# Patient Record
Sex: Male | Born: 1980
Health system: Southern US, Community
[De-identification: ages and names within clinical notes are randomized; demographics above are authoritative.]

## PROBLEM LIST (undated history)

## (undated) DIAGNOSIS — K760 Fatty (change of) liver, not elsewhere classified: Secondary | ICD-10-CM

## (undated) DIAGNOSIS — I880 Nonspecific mesenteric lymphadenitis: Secondary | ICD-10-CM

## (undated) DIAGNOSIS — F909 Attention-deficit hyperactivity disorder, unspecified type: Secondary | ICD-10-CM

## (undated) DIAGNOSIS — E785 Hyperlipidemia, unspecified: Secondary | ICD-10-CM

## (undated) DIAGNOSIS — E66812 Obesity, class 2: Secondary | ICD-10-CM

## (undated) DIAGNOSIS — M109 Gout, unspecified: Secondary | ICD-10-CM

## (undated) DIAGNOSIS — R7303 Prediabetes: Secondary | ICD-10-CM

## (undated) DIAGNOSIS — K219 Gastro-esophageal reflux disease without esophagitis: Secondary | ICD-10-CM

## (undated) DIAGNOSIS — I10 Essential (primary) hypertension: Secondary | ICD-10-CM

## (undated) DIAGNOSIS — F319 Bipolar disorder, unspecified: Secondary | ICD-10-CM

## (undated) DIAGNOSIS — E669 Obesity, unspecified: Secondary | ICD-10-CM

## (undated) HISTORY — DX: Obesity, class 2: E66.812

## (undated) HISTORY — DX: Fatty (change of) liver, not elsewhere classified: K76.0

## (undated) HISTORY — DX: Gastro-esophageal reflux disease without esophagitis: K21.9

## (undated) HISTORY — DX: Attention-deficit hyperactivity disorder, unspecified type: F90.9

## (undated) HISTORY — DX: Gout, unspecified: M10.9

## (undated) HISTORY — DX: Essential (primary) hypertension: I10

## (undated) HISTORY — DX: Bipolar disorder, unspecified: F31.9

## (undated) HISTORY — DX: Obesity, unspecified: E66.9

## (undated) HISTORY — DX: Prediabetes: R73.03

## (undated) HISTORY — DX: Hyperlipidemia, unspecified: E78.5

## (undated) HISTORY — DX: Nonspecific mesenteric lymphadenitis: I88.0

---

## 2008-01-07 DIAGNOSIS — K760 Fatty (change of) liver, not elsewhere classified: Secondary | ICD-10-CM

## 2008-01-07 DIAGNOSIS — I880 Nonspecific mesenteric lymphadenitis: Secondary | ICD-10-CM

## 2008-01-07 HISTORY — DX: Fatty (change of) liver, not elsewhere classified: K76.0

## 2008-01-07 HISTORY — DX: Nonspecific mesenteric lymphadenitis: I88.0

## 2008-09-24 ENCOUNTER — Ambulatory Visit: Payer: Self-pay | Admitting: Diagnostic Radiology

## 2008-09-24 ENCOUNTER — Emergency Department (HOSPITAL_BASED_OUTPATIENT_CLINIC_OR_DEPARTMENT_OTHER): Admission: EM | Admit: 2008-09-24 | Discharge: 2008-09-24 | Payer: Self-pay | Admitting: Emergency Medicine

## 2008-10-23 ENCOUNTER — Emergency Department (HOSPITAL_BASED_OUTPATIENT_CLINIC_OR_DEPARTMENT_OTHER): Admission: EM | Admit: 2008-10-23 | Discharge: 2008-10-23 | Payer: Self-pay | Admitting: Emergency Medicine

## 2008-10-23 ENCOUNTER — Ambulatory Visit: Payer: Self-pay | Admitting: Radiology

## 2010-04-11 LAB — COMPREHENSIVE METABOLIC PANEL
AST: 34 U/L (ref 0–37)
Alkaline Phosphatase: 56 U/L (ref 39–117)
BUN: 15 mg/dL (ref 6–23)
Calcium: 9.4 mg/dL (ref 8.4–10.5)
Chloride: 103 mEq/L (ref 96–112)
GFR calc non Af Amer: >60 mL/min (ref >60)
Glucose, Bld: 88 mg/dL (ref 70–99)

## 2010-04-11 LAB — CBC
HCT: 48.1 % (ref 39.0–52.0)
Hemoglobin: 16.6 g/dL (ref 13.0–17.0)
MCV: 84 fL (ref 78.0–100.0)
Platelets: 365 10*3/uL (ref 150–400)
RBC: 5.73 MIL/uL (ref 4.22–5.81)

## 2010-04-11 LAB — URINALYSIS, ROUTINE W REFLEX MICROSCOPIC
Bilirubin Urine: NEGATIVE
Glucose, UA: NEGATIVE mg/dL
Hgb urine dipstick: NEGATIVE
pH: 6.5 (ref 5.0–8.0)

## 2010-04-11 LAB — DIFFERENTIAL
Basophils Relative: 0 % (ref 0–1)
Eosinophils Absolute: 0.2 10*3/uL (ref 0.0–0.7)
Lymphs Abs: 1.9 10*3/uL (ref 0.7–4.0)
Monocytes Absolute: 1 10*3/uL (ref 0.1–1.0)
Neutro Abs: 4 10*3/uL (ref 1.7–7.7)

## 2010-04-11 LAB — LIPASE, BLOOD: Lipase: 72 U/L (ref 23–300)

## 2010-04-12 LAB — DIFFERENTIAL
Lymphocytes Relative: 38 % (ref 12–46)
Monocytes Relative: 6 % (ref 3–12)
Neutro Abs: 3.1 10*3/uL (ref 1.7–7.7)
Neutrophils Relative %: 54 % (ref 43–77)

## 2010-04-12 LAB — BASIC METABOLIC PANEL
BUN: 11 mg/dL (ref 6–23)
CO2: 31 mEq/L (ref 19–32)
Creatinine, Ser: 1.1 mg/dL (ref 0.4–1.5)
GFR calc non Af Amer: >60 mL/min (ref >60)
Potassium: 4.1 mEq/L (ref 3.5–5.1)
Sodium: 143 mEq/L (ref 135–145)

## 2010-04-12 LAB — CBC
Hemoglobin: 14.3 g/dL (ref 13.0–17.0)
MCV: 86.2 fL (ref 78.0–100.0)
Platelets: 255 10*3/uL (ref 150–400)
RBC: 4.92 MIL/uL (ref 4.22–5.81)
WBC: 5.6 10*3/uL (ref 4.0–10.5)

## 2010-04-12 LAB — POCT CARDIAC MARKERS: Troponin i, poc: <0.05 ng/mL (ref 0.00–0.09)

## 2010-11-02 IMAGING — CT CT PELVIS W/ CM
2 of 3 series · 16 of 46 positions shown, 18 images · IV contrast (APPLIED)
Comparison: None available.

CT ABDOMEN

CLINICAL DATA: Nausea vomiting and diarrhea times 1 week.  Mid
abdominal pain.

CT ABDOMEN AND PELVIS WITH CONTRAST
TECHNIQUE: Multidetector CT imaging of the abdomen and pelvis was
performed using the standard protocol following bolus
administration of intravenous contrast.
Contrast: 100 ml Omnipaque 300

[Series 2: abd/pelvis 5.0 b31f · axial · 0.73mm/px · z∈[+1014,+1434]mm · 13 of 98 slices shown, 15 images]
[im 7/98  soft-tissue]
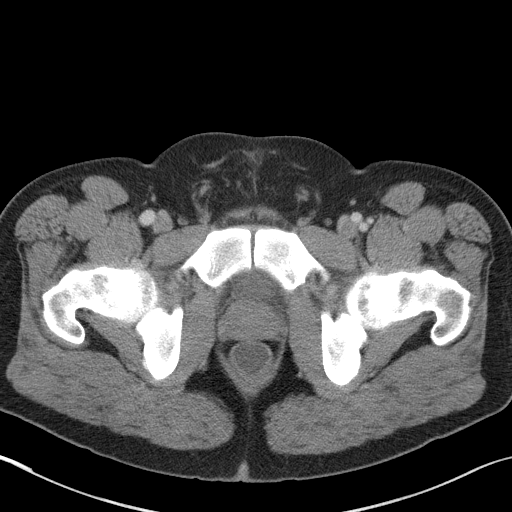
[im 7/98  bone]
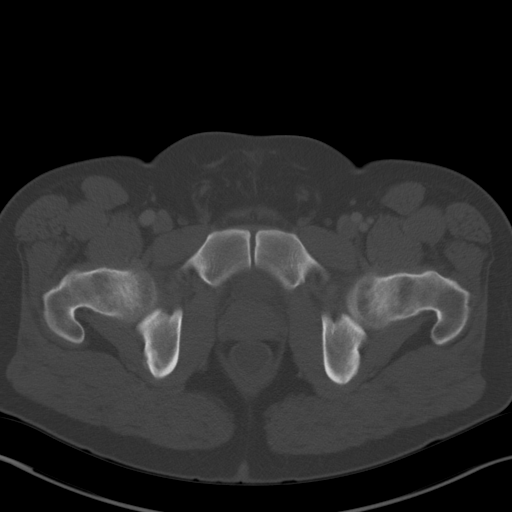
[im 13/98  soft-tissue]
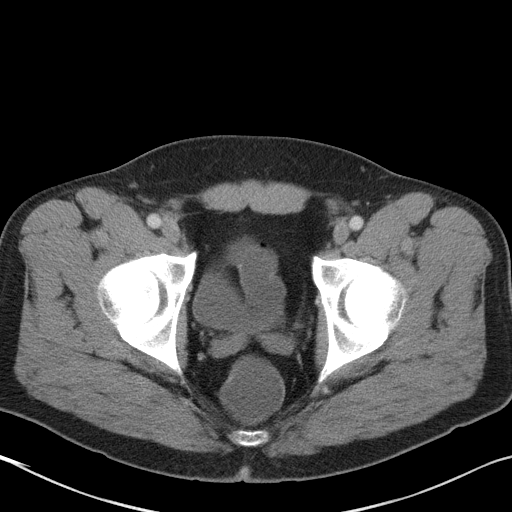
[im 19/98  soft-tissue]
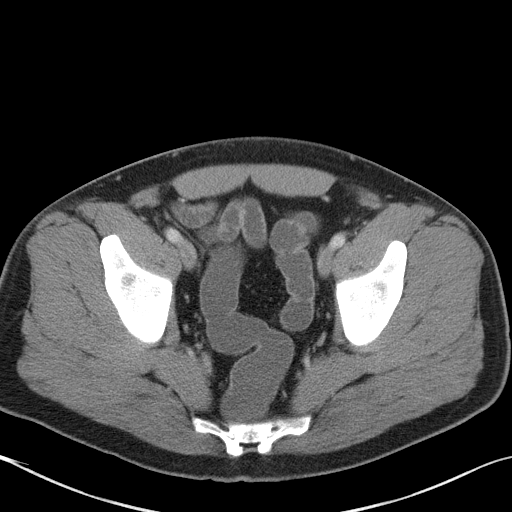
[im 29/98  soft-tissue]
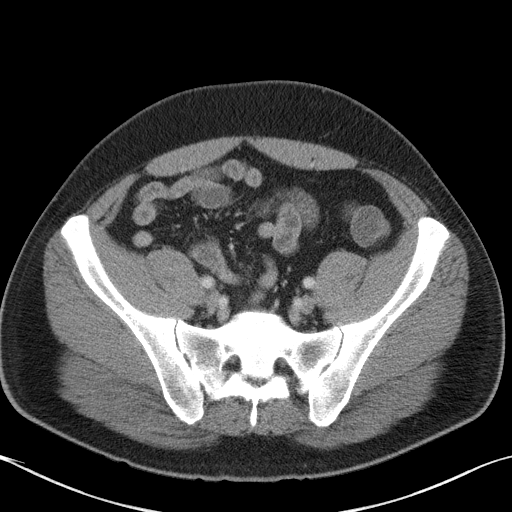
[im 35/98  soft-tissue]
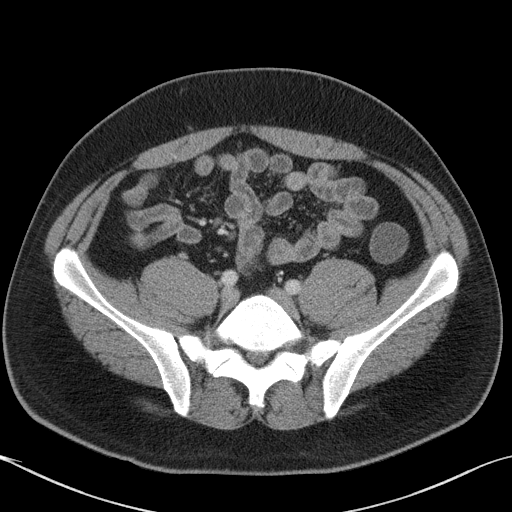
[im 41/98  soft-tissue]
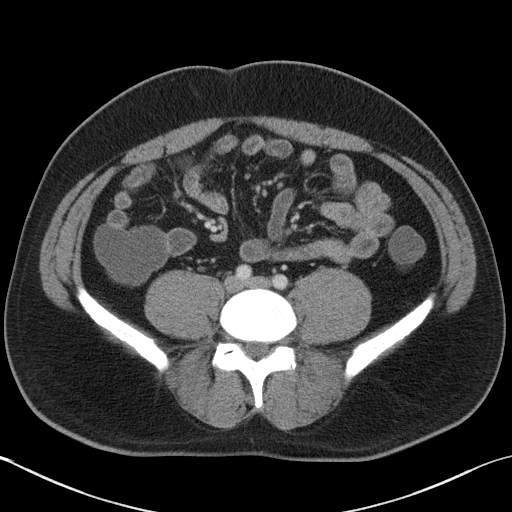
[im 51/98  soft-tissue]
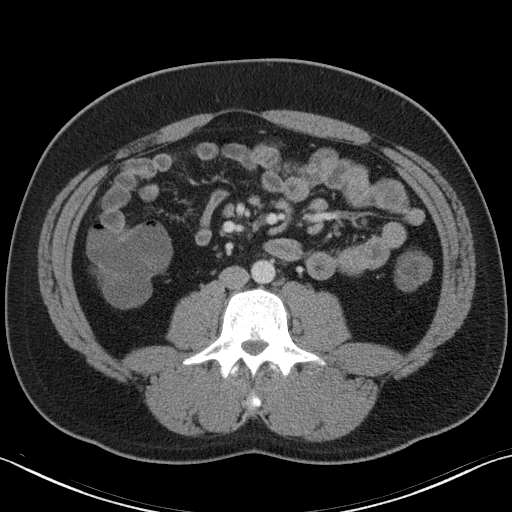
[im 57/98  soft-tissue]
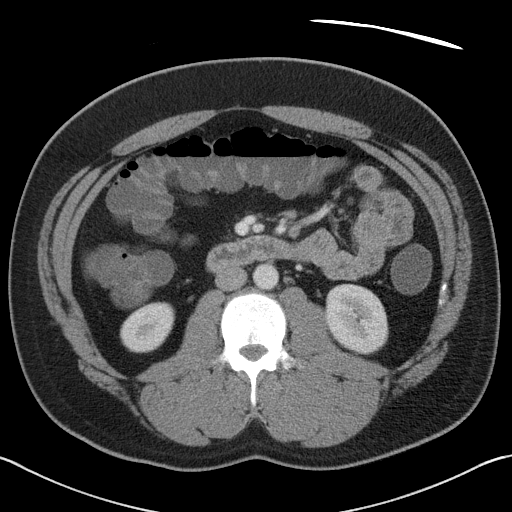
[im 63/98  soft-tissue]
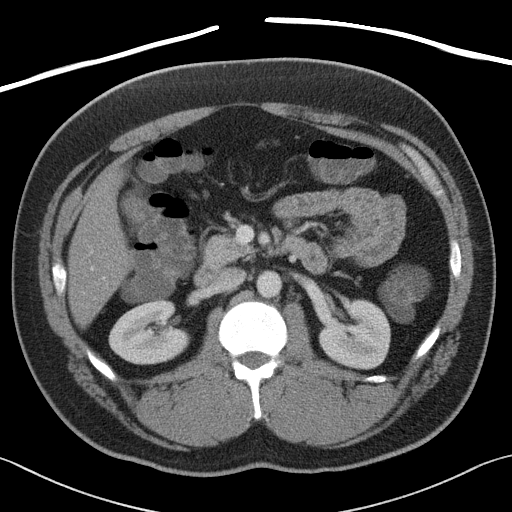
[im 63/98  bone]
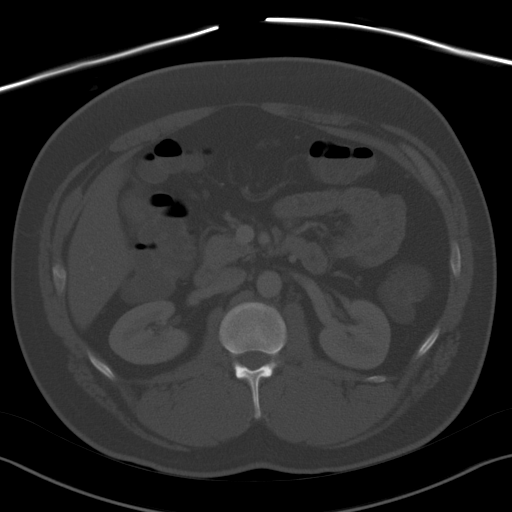
[im 69/98  soft-tissue]
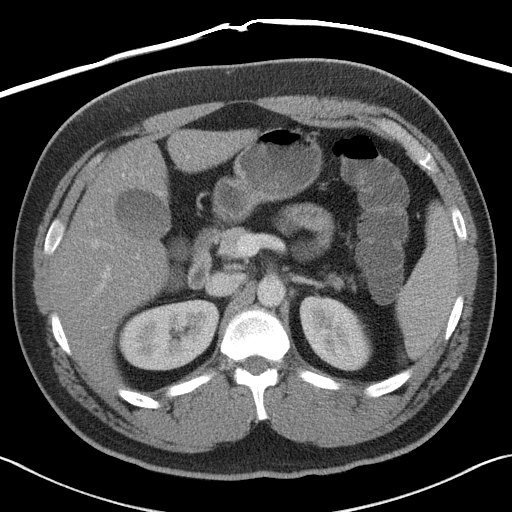
[im 79/98  soft-tissue]
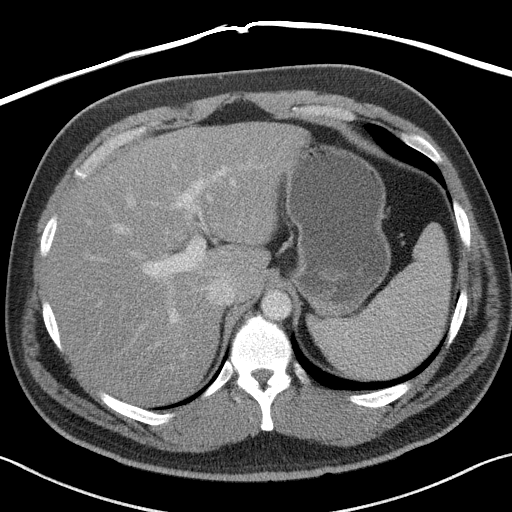
[im 85/98  soft-tissue]
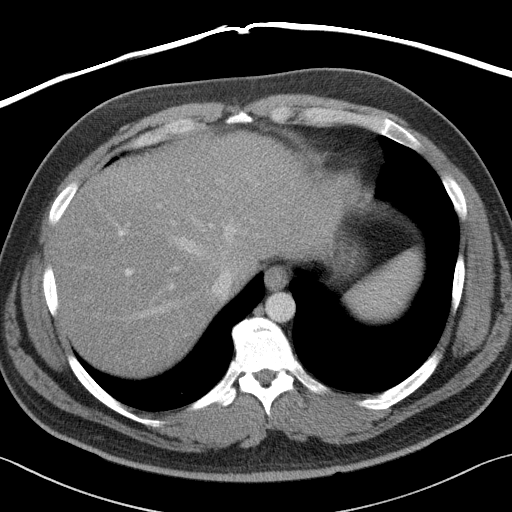
[im 91/98  soft-tissue]
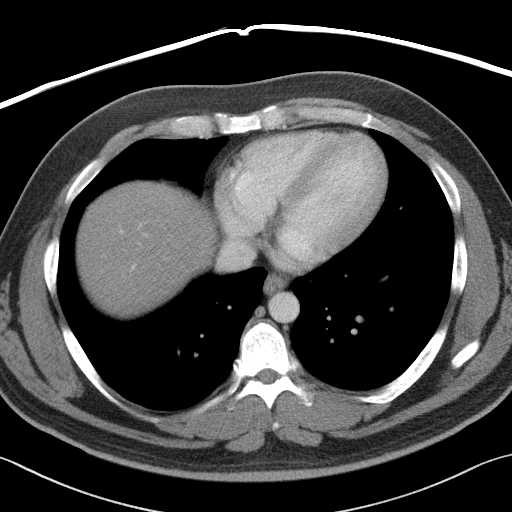

[Series 4: abd/pelvis 3.0 coronal · coronal · 0.75mm/px · 3 of 99 slices shown]
[im 33/99  soft-tissue]
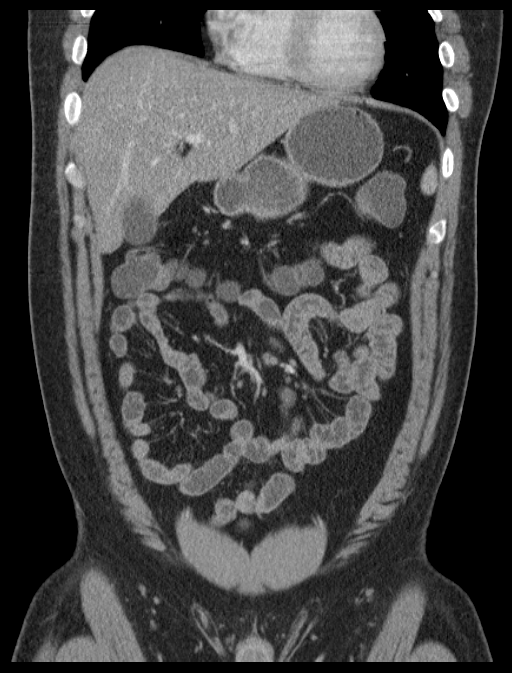
[im 44/99  soft-tissue]
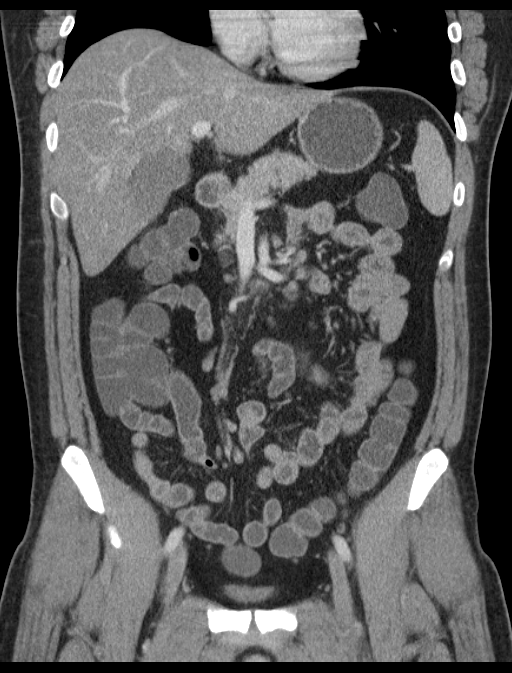
[im 55/99  soft-tissue]
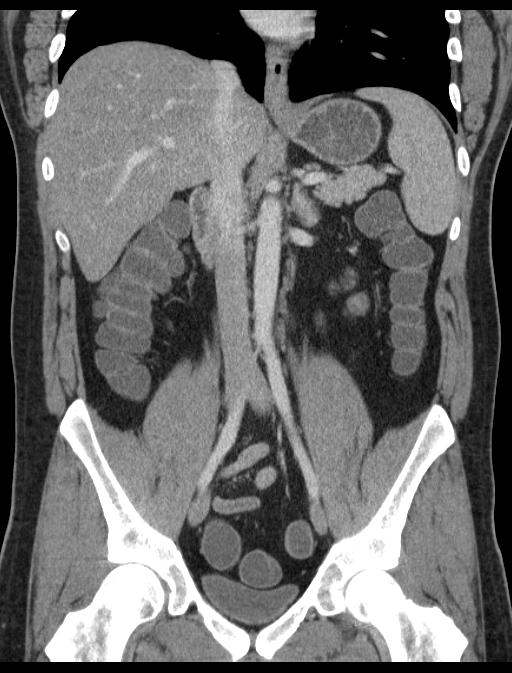

[16 of 46 positions shown; findings below may reference images not displayed]

FINDINGS: The lung bases are clear without focal nodule, mass, or
airspace disease.  The heart size is normal.  There is no
significant pleural or pericardial effusion.

There is probable diffuse fatty infiltration of the liver.  No
focal hepatic lesions are evident.  The spleen is within normal
limits.  The stomach, duodenum, and pancreas are normal.  The
common bile duct and gallbladder are within normal limits.  The
adrenal glands and kidneys are normal bilaterally.  There is no
significant abdominal adenopathy or free fluid.  The small bowel is
unremarkable.  Scattered lymph nodes are seen throughout the
mesentery.  The bone windows demonstrate degenerative change of the
lower thoracic spine.  No focal lytic or blastic lesions are
evident.
IMPRESSION: 1.  Scattered enlarged mesenteric lymph nodes.  This is compatible
with mesenteric adenitis or a nonspecific enteritis.
2.  No other acute abnormality of the abdomen.
3.  Probable diffuse fatty infiltration of the liver.

CT PELVIS
FINDINGS: The rectosigmoid and descending colon is fluid-filled
and mildly dilated.  No mass lesion or focal wall thickening is
evident.  Fluid levels are present within the transverse colon.
The appendix is visualized and within normal limits.  The distal
small bowel is unremarkable.  Urinary bladder and prostate gland
are normal.  There is no significant pelvic adenopathy or free
fluid.  The bone windows are unremarkable.
IMPRESSION: 1.  Fluid-filled colon, compatible with diarrhea.
2.  No other acute abnormality of the pelvis.

## 2015-07-16 DIAGNOSIS — Z791 Long term (current) use of non-steroidal anti-inflammatories (NSAID): Secondary | ICD-10-CM | POA: Diagnosis not present

## 2015-07-16 DIAGNOSIS — K0889 Other specified disorders of teeth and supporting structures: Secondary | ICD-10-CM | POA: Diagnosis not present

## 2015-07-16 DIAGNOSIS — R220 Localized swelling, mass and lump, head: Secondary | ICD-10-CM | POA: Diagnosis not present

## 2015-07-16 DIAGNOSIS — R590 Localized enlarged lymph nodes: Secondary | ICD-10-CM | POA: Diagnosis not present

## 2015-07-16 DIAGNOSIS — Z87891 Personal history of nicotine dependence: Secondary | ICD-10-CM | POA: Diagnosis not present

## 2015-07-16 DIAGNOSIS — K122 Cellulitis and abscess of mouth: Secondary | ICD-10-CM | POA: Diagnosis not present

## 2015-07-16 DIAGNOSIS — K029 Dental caries, unspecified: Secondary | ICD-10-CM | POA: Diagnosis not present

## 2015-07-16 DIAGNOSIS — R51 Headache: Secondary | ICD-10-CM | POA: Diagnosis not present

## 2015-07-16 DIAGNOSIS — K0381 Cracked tooth: Secondary | ICD-10-CM | POA: Diagnosis not present

## 2015-07-16 DIAGNOSIS — M26603 Bilateral temporomandibular joint disorder, unspecified: Secondary | ICD-10-CM | POA: Diagnosis not present

## 2016-01-07 DIAGNOSIS — R7303 Prediabetes: Secondary | ICD-10-CM | POA: Insufficient documentation

## 2016-01-07 DIAGNOSIS — I10 Essential (primary) hypertension: Secondary | ICD-10-CM

## 2016-01-07 DIAGNOSIS — E119 Type 2 diabetes mellitus without complications: Secondary | ICD-10-CM

## 2016-01-07 DIAGNOSIS — I152 Hypertension secondary to endocrine disorders: Secondary | ICD-10-CM | POA: Insufficient documentation

## 2016-01-07 DIAGNOSIS — E1169 Type 2 diabetes mellitus with other specified complication: Secondary | ICD-10-CM | POA: Insufficient documentation

## 2016-01-07 HISTORY — DX: Type 2 diabetes mellitus without complications: E11.9

## 2016-01-07 HISTORY — DX: Essential (primary) hypertension: I10

## 2016-02-25 DIAGNOSIS — F3181 Bipolar II disorder: Secondary | ICD-10-CM | POA: Diagnosis not present

## 2016-03-24 DIAGNOSIS — F3181 Bipolar II disorder: Secondary | ICD-10-CM | POA: Diagnosis not present

## 2016-04-18 ENCOUNTER — Ambulatory Visit: Payer: Self-pay | Admitting: Family Medicine

## 2016-04-18 ENCOUNTER — Encounter: Payer: Self-pay | Admitting: Family Medicine

## 2016-04-18 NOTE — Progress Notes (Deleted)
Office Note 04/18/2016  CC: No chief complaint on file.   HPI:  Erik Davis is a 36 y.o. male who is here to establish care Patient's most recent primary MD: *** Old records *** reviewed prior to or during today's visit.  No past medical history on file.  No past surgical history on file.  No family history on file.  Social History   Social History  . Marital status: Single    Spouse name: N/A  . Number of children: N/A  . Years of education: N/A   Occupational History  . Not on file.   Social History Main Topics  . Smoking status: Not on file  . Smokeless tobacco: Not on file  . Alcohol use Not on file  . Drug use: Unknown  . Sexual activity: Not on file   Other Topics Concern  . Not on file   Social History Narrative  . No narrative on file    No outpatient encounter prescriptions on file as of 04/18/2016.   No facility-administered encounter medications on file as of 04/18/2016.     Allergies not on file  ROS *** PE; There were no vitals taken for this visit. *** Pertinent labs:  ***  ASSESSMENT AND PLAN:   No problem-specific Assessment & Plan notes found for this encounter.   No Follow-up on file.

## 2016-04-21 DIAGNOSIS — F3181 Bipolar II disorder: Secondary | ICD-10-CM | POA: Diagnosis not present

## 2016-04-24 ENCOUNTER — Ambulatory Visit (INDEPENDENT_AMBULATORY_CARE_PROVIDER_SITE_OTHER): Payer: BLUE CROSS/BLUE SHIELD | Admitting: Family Medicine

## 2016-04-24 ENCOUNTER — Encounter: Payer: Self-pay | Admitting: Family Medicine

## 2016-04-24 VITALS — BP 148/83 | HR 79 | Temp 98.0°F | Resp 16 | Ht 73.5 in | Wt 295.8 lb

## 2016-04-24 DIAGNOSIS — Z0000 Encounter for general adult medical examination without abnormal findings: Secondary | ICD-10-CM | POA: Diagnosis not present

## 2016-04-24 DIAGNOSIS — I10 Essential (primary) hypertension: Secondary | ICD-10-CM

## 2016-04-24 MED ORDER — IRBESARTAN 150 MG PO TABS: 150.0000 mg | ORAL_TABLET | Freq: Every day | ORAL | 1 refills | Status: DC

## 2016-04-24 NOTE — Progress Notes (Signed)
Pre visit review using our clinic review tool, if applicable. No additional management support is needed unless otherwise documented below in the visit note. 

## 2016-04-24 NOTE — Progress Notes (Signed)
Office Note 04/24/2016  CC:  Chief Complaint  Patient presents with  . Establish Care  . Annual Exam    Pt is not fasting.    HPI:  Pepper Wyndham is a 36 y.o. male who is here to establish care Patient's most recent primary MD: no PCP in 15 yrs.  Sees a psychiatrist who manages his meds. Old records in [facility] EMR were reviewed prior to or during today's visit.  Eye exam: recently got corrective lenses. Dental preventative visits: "rarely".  Exercise: lifts weights, walks treadmill, plays basketball.  Elevated bp: home monitoring 140s/90s but never been treated for HTN.  Same at psych visits.   Past Medical History:  Diagnosis Date  . Adult ADHD    managed by psychiatrist  . Bipolar affective disorder (HCC)    managed by psych  . Fatty liver 2010   Noted on CT abd  . Gout    two episodes1 yr apart--big toe.  Never been on preventative meds.  . Nonspecific mesenteric adenitis 2010   Hx of    History reviewed. No pertinent surgical history.  Family History  Problem Relation Age of Onset  . Mental retardation Father   . Depression Maternal Grandmother   . Cancer Paternal Grandmother     Lung?    Social History   Social History  . Marital status: Single    Spouse name: N/A  . Number of children: N/A  . Years of education: N/A   Occupational History  . Not on file.   Social History Main Topics  . Smoking status: Former Smoker    Packs/day: 0.50    Years: 7.00    Types: Cigarettes    Quit date: 01/07/2004  . Smokeless tobacco: Never Used  . Alcohol use Yes     Comment: rare - 2-3 beers a year  . Drug use: No  . Sexual activity: Not on file   Other Topics Concern  . Not on file   Social History Narrative   Single, two teenage sons.   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   No current tobacco: 5 yr pack hx--quit 2006.   No alcohol.   No hx of prob with alc or drugs.    Outpatient Encounter Prescriptions as of 04/24/2016   Medication Sig  . amphetamine-dextroamphetamine (ADDERALL XR) 10 MG 24 hr capsule Take 10 mg by mouth daily.  Marland Kitchen lamoTRIgine (LAMICTAL) 100 MG tablet Take 100 mg by mouth daily.  . irbesartan (AVAPRO) 150 MG tablet Take 1 tablet (150 mg total) by mouth daily.   No facility-administered encounter medications on file as of 04/24/2016.     No Known Allergies  ROS Review of Systems  Constitutional: Negative for appetite change, chills, fatigue and fever.  HENT: Negative for congestion, dental problem, ear pain and sore throat.   Eyes: Negative for discharge, redness and visual disturbance.  Respiratory: Negative for cough, chest tightness, shortness of breath and wheezing.   Cardiovascular: Negative for chest pain, palpitations and leg swelling.  Gastrointestinal: Negative for abdominal pain, blood in stool, diarrhea, nausea and vomiting.  Genitourinary: Negative for difficulty urinating, dysuria, flank pain, frequency, hematuria and urgency.  Musculoskeletal: Negative for arthralgias, back pain, joint swelling, myalgias and neck stiffness.  Skin: Negative for pallor and rash.  Neurological: Negative for dizziness, speech difficulty, weakness and headaches.  Hematological: Negative for adenopathy. Does not bruise/bleed easily.  Psychiatric/Behavioral: Negative for confusion and sleep disturbance. The patient is not nervous/anxious.  PE; Blood pressure (!) 148/83, pulse 79, temperature 98 F (36.7 C), temperature source Oral, resp. rate 16, height 6' 1.5" (1.867 m), weight 295 lb 12 oz (134.2 kg), SpO2 96 %. Body mass index is 38.49 kg/m.  Gen: Alert, well appearing.  Patient is oriented to person, place, time, and situation. AFFECT: pleasant, lucid thought and speech. ENT: Ears: EACs clear, normal epithelium.  TMs with good light reflex and landmarks bilaterally.  Eyes: no injection, icteris, swelling, or exudate.  EOMI, PERRLA. Nose: no drainage or turbinate edema/swelling.  No  injection or focal lesion.  Mouth: lips without lesion/swelling.  Oral mucosa pink and moist.  Dentition intact and without obvious caries or gingival swelling.  Oropharynx without erythema, exudate, or swelling.  Neck: supple/nontender.  No LAD, mass, or TM.  Carotid pulses 2+ bilaterally, without bruits. CV: RRR, no m/r/g.   LUNGS: CTA bilat, nonlabored resps, good aeration in all lung fields. ABD: soft, NT, ND, BS normal.  No hepatospenomegaly or mass.  No bruits. EXT: no clubbing, cyanosis, or edema.  Musculoskeletal: no joint swelling, erythema, warmth, or tenderness.  ROM of all joints intact. Skin - no sores or suspicious lesions or rashes or color changes  Pertinent labs:  None today  ASSESSMENT AND PLAN:   1) Essential HTN; sounds like he's had this x 10 yrs but never treated. Start irbesartan  qd today. He'll return for lytes/cr when he comes back for fasting HP labs. Monitor bp at home 3-4 times per week, return to review these with me in 3-4 weeks. DASH diet discussed, handout given.  2) Health maintenance exam: Reviewed age and gender appropriate health maintenance issues (prudent diet, regular exercise, health risks of tobacco and excessive alcohol, use of seatbelts, fire alarms in home, use of sunscreen).  Also reviewed age and gender appropriate health screening as well as vaccine recommendations. Return when fasting for HP labs. Tetanus UTD (2015).  An After Visit Summary was printed and given to the patient.  Return f/u 3-4 weeks HTN.  fasting lab visit at pt's convenience.  Signed:  Santiago Bumpers, MD           04/24/2016

## 2016-04-25 ENCOUNTER — Other Ambulatory Visit (INDEPENDENT_AMBULATORY_CARE_PROVIDER_SITE_OTHER): Payer: BLUE CROSS/BLUE SHIELD

## 2016-04-25 DIAGNOSIS — Z0000 Encounter for general adult medical examination without abnormal findings: Secondary | ICD-10-CM | POA: Diagnosis not present

## 2016-04-25 LAB — CBC WITH DIFFERENTIAL/PLATELET
BASOS ABS: 0.1 10*3/uL (ref 0.0–0.1)
BASOS PCT: 1 % (ref 0.0–3.0)
EOS ABS: 0.1 10*3/uL (ref 0.0–0.7)
Eosinophils Relative: 1.8 % (ref 0.0–5.0)
HEMATOCRIT: 45.2 % (ref 39.0–52.0)
HEMOGLOBIN: 15.4 g/dL (ref 13.0–17.0)
Lymphocytes Relative: 44.4 % (ref 12.0–46.0)
Lymphs Abs: 3 10*3/uL (ref 0.7–4.0)
MCHC: 34 g/dL (ref 30.0–36.0)
MCV: 85.9 fl (ref 78.0–100.0)
MONO ABS: 0.4 10*3/uL (ref 0.1–1.0)
Monocytes Relative: 5.8 % (ref 3.0–12.0)
Neutro Abs: 3.2 10*3/uL (ref 1.4–7.7)
Neutrophils Relative %: 47 % (ref 43.0–77.0)
Platelets: 295 10*3/uL (ref 150.0–400.0)
RBC: 5.27 Mil/uL (ref 4.22–5.81)
RDW: 13.3 % (ref 11.5–15.5)
WBC: 6.8 10*3/uL (ref 4.0–10.5)

## 2016-04-25 LAB — COMPREHENSIVE METABOLIC PANEL
ALBUMIN: 4.5 g/dL (ref 3.5–5.2)
ALK PHOS: 42 U/L (ref 39–117)
ALT: 27 U/L (ref 0–53)
AST: 21 U/L (ref 0–37)
BUN: 10 mg/dL (ref 6–23)
CALCIUM: 9.6 mg/dL (ref 8.4–10.5)
CHLORIDE: 104 meq/L (ref 96–112)
CO2: 30 mEq/L (ref 19–32)
Creatinine, Ser: 1.09 mg/dL (ref 0.40–1.50)
GFR: 80.85 mL/min (ref >60.00)
Glucose, Bld: 126 mg/dL — ABNORMAL HIGH (ref 70–99)
POTASSIUM: 4.2 meq/L (ref 3.5–5.1)
Sodium: 139 mEq/L (ref 135–145)
TOTAL PROTEIN: 7.3 g/dL (ref 6.0–8.3)
Total Bilirubin: 0.6 mg/dL (ref 0.2–1.2)

## 2016-04-25 LAB — LIPID PANEL
CHOLESTEROL: 198 mg/dL (ref 0–200)
HDL: 32.9 mg/dL — AB (ref >39.00)
LDL CALC: 132 mg/dL — AB (ref 0–99)
NonHDL: 164.7
TRIGLYCERIDES: 163 mg/dL — AB (ref 0.0–149.0)
Total CHOL/HDL Ratio: 6
VLDL: 32.6 mg/dL (ref 0.0–40.0)

## 2016-04-25 LAB — TSH: TSH: 1.17 u[IU]/mL (ref 0.35–4.50)

## 2016-04-28 ENCOUNTER — Other Ambulatory Visit (INDEPENDENT_AMBULATORY_CARE_PROVIDER_SITE_OTHER): Payer: BLUE CROSS/BLUE SHIELD

## 2016-04-28 ENCOUNTER — Other Ambulatory Visit: Payer: Self-pay | Admitting: Family Medicine

## 2016-04-28 DIAGNOSIS — R7301 Impaired fasting glucose: Secondary | ICD-10-CM

## 2016-04-28 LAB — HEMOGLOBIN A1C: HEMOGLOBIN A1C: 5.9 % (ref 4.6–6.5)

## 2016-05-16 ENCOUNTER — Ambulatory Visit (INDEPENDENT_AMBULATORY_CARE_PROVIDER_SITE_OTHER): Payer: BLUE CROSS/BLUE SHIELD | Admitting: Family Medicine

## 2016-05-16 ENCOUNTER — Encounter: Payer: Self-pay | Admitting: Family Medicine

## 2016-05-16 VITALS — BP 131/69 | HR 73 | Temp 98.1°F | Resp 16 | Ht 73.5 in | Wt 289.5 lb

## 2016-05-16 DIAGNOSIS — R7301 Impaired fasting glucose: Secondary | ICD-10-CM

## 2016-05-16 DIAGNOSIS — I10 Essential (primary) hypertension: Secondary | ICD-10-CM

## 2016-05-16 NOTE — Progress Notes (Signed)
OFFICE VISIT  05/16/2016   CC:  Chief Complaint  Patient presents with  . Follow-up    HTN,    HPI:    Patient is a 36 y.o.  male who presents for 3 week f/u HTN--started irbesartan 150mg  qd last visit.  Also, fasting labs 04/25/16 were all normal except fasting glucose was 126, HbA1c was 5.9% at that time. He was told to return for repeat fasting serum glucose.  He now says that he forgot to tell me that he did have a coke the morning before he got his "fasting" labs.  He also did not fast a full 8 hours prior to that.  HOME bp's: low 130s/70 avg, no HR info. No side effects from the irbesartan.  Past Medical History:  Diagnosis Date  . Adult ADHD    managed by psychiatrist  . Bipolar affective disorder (HCC)    managed by psych  . Fatty liver 2010   Noted on CT abd  . Gout    two episodes1 yr apart--big toe.  Never been on preventative meds.  . Nonspecific mesenteric adenitis 2010   Hx of    No past surgical history on file.  Outpatient Medications Prior to Visit  Medication Sig Dispense Refill  . amphetamine-dextroamphetamine (ADDERALL XR) 10 MG 24 hr capsule Take 10 mg by mouth daily.    . irbesartan (AVAPRO) 150 MG tablet Take 1 tablet (150 mg total) by mouth daily. 30 tablet 1  . lamoTRIgine (LAMICTAL) 100 MG tablet Take 100 mg by mouth daily.     No facility-administered medications prior to visit.     No Known Allergies  ROS As per HPI  PE: Blood pressure 131/69, pulse 73, temperature 98.1 F (36.7 C), temperature source Oral, resp. rate 16, height 6' 1.5" (1.867 m), weight 289 lb 8 oz (131.3 kg), SpO2 96 %. Gen: Alert, well appearing.  Patient is oriented to person, place, time, and situation. AFFECT: pleasant, lucid thought and speech. CV: RRR, no m/r/g.   LUNGS: CTA bilat, nonlabored resps, good aeration in all lung fields. EXT: no clubbing, cyanosis, or edema.   LABS:    Chemistry      Component Value Date/Time   NA 139 04/25/2016 0808   K  4.2 04/25/2016 0808   CL 104 04/25/2016 0808   CO2 30 04/25/2016 0808   BUN 10 04/25/2016 0808   CREATININE 1.09 04/25/2016 0808      Component Value Date/Time   CALCIUM 9.6 04/25/2016 0808   ALKPHOS 42 04/25/2016 0808   AST 21 04/25/2016 0808   ALT 27 04/25/2016 0808   BILITOT 0.6 04/25/2016 0808     Glucose 04/25/16= 126.  Lab Results  Component Value Date   HGBA1C 5.9 04/28/2016   IMPRESSION AND PLAN:  1) HTN; new dx.  Doing well on irbesartan 150mg  qd. Continue periodic home bp monitoring.  Call or return if persistently >140 systolic or > 90 diastolic.  2) IFG: sounds like he actually was NOT fasting when glucose was 126. We did discuss the fact that his A1c technically puts him in the prediabetic range. He will continue with his good efforts at a healthy lifestyle + wt loss---he has lost 15-20 lbs over the last 3 mo per his report today. He will still keep the plan of returning for fasting lab visit to recheck serum glucose.  An After Visit Summary was printed and given to the patient.  FOLLOW UP: Return in about 6 months (around 11/16/2016)  for routine chronic illness f/u (also, lab visit for fasting glucose at pt's earliest convenience).  Signed:  Santiago BumpersPhil McGowen, MD           05/16/2016

## 2016-05-26 ENCOUNTER — Other Ambulatory Visit: Payer: BLUE CROSS/BLUE SHIELD

## 2016-05-29 ENCOUNTER — Other Ambulatory Visit: Payer: BLUE CROSS/BLUE SHIELD

## 2016-05-29 ENCOUNTER — Other Ambulatory Visit: Payer: Self-pay

## 2016-06-04 ENCOUNTER — Other Ambulatory Visit: Payer: BLUE CROSS/BLUE SHIELD

## 2016-06-16 DIAGNOSIS — F3181 Bipolar II disorder: Secondary | ICD-10-CM | POA: Diagnosis not present

## 2016-06-18 ENCOUNTER — Other Ambulatory Visit: Payer: Self-pay | Admitting: Family Medicine

## 2016-09-15 ENCOUNTER — Telehealth: Payer: Self-pay | Admitting: *Deleted

## 2016-09-15 NOTE — Telephone Encounter (Signed)
Pt advised and voiced understanding. Apt made for tomorrow (09/16/16) at 9:45am.

## 2016-09-15 NOTE — Telephone Encounter (Signed)
Pt LMOM on 09/15/16 at 3:43pm wanting a call to see if Dr. Milinda CaveMcGowen will be willing to start filling medications he gets from his specialist.   SW pt he wants to know if Dr. Milinda CaveMcGowen will fill his lamotrigine and Adderall. He stated that he takes the lamotrigine for his Bipolar disorder and the Adderall for his ADHD. He stated that he has to pay a $45 copay to see the specialist. Please advise. Thanks.

## 2016-09-15 NOTE — Telephone Encounter (Signed)
I will rake over prescribing responsibilities for his psychiatric meds, BUT I need to see him in office regarding his ADHD and bipolar disorder before I start rx'ing them.-thx

## 2016-09-16 ENCOUNTER — Ambulatory Visit (INDEPENDENT_AMBULATORY_CARE_PROVIDER_SITE_OTHER): Payer: BLUE CROSS/BLUE SHIELD | Admitting: Family Medicine

## 2016-09-16 ENCOUNTER — Encounter: Payer: Self-pay | Admitting: Family Medicine

## 2016-09-16 VITALS — BP 132/78 | HR 73 | Temp 97.9°F | Resp 16 | Ht 73.5 in | Wt 290.2 lb

## 2016-09-16 DIAGNOSIS — Z23 Encounter for immunization: Secondary | ICD-10-CM

## 2016-09-16 DIAGNOSIS — I10 Essential (primary) hypertension: Secondary | ICD-10-CM

## 2016-09-16 DIAGNOSIS — F909 Attention-deficit hyperactivity disorder, unspecified type: Secondary | ICD-10-CM | POA: Insufficient documentation

## 2016-09-16 DIAGNOSIS — R7303 Prediabetes: Secondary | ICD-10-CM | POA: Diagnosis not present

## 2016-09-16 DIAGNOSIS — F3172 Bipolar disorder, in full remission, most recent episode hypomanic: Secondary | ICD-10-CM

## 2016-09-16 DIAGNOSIS — F319 Bipolar disorder, unspecified: Secondary | ICD-10-CM | POA: Insufficient documentation

## 2016-09-16 MED ORDER — AMPHETAMINE-DEXTROAMPHET ER 10 MG PO CP24: 10.0000 mg | ORAL_CAPSULE | Freq: Every day | ORAL | 0 refills | Status: DC

## 2016-09-16 MED ORDER — LAMOTRIGINE 100 MG PO TABS: 100.0000 mg | ORAL_TABLET | Freq: Every day | ORAL | 3 refills | Status: DC

## 2016-09-16 NOTE — Addendum Note (Signed)
Addended by: Smitty KnudsenSUTHERLAND,  K on: 09/16/2016 10:24 AM   Modules accepted: Orders

## 2016-09-16 NOTE — Progress Notes (Signed)
OFFICE VISIT  09/16/2016   CC:  Chief Complaint  Patient presents with  . ADHD  . Manic Behavior   HPI:    Patient is a 36 y.o. Caucasian male who presents for discussion of adult ADD and bipolar disorder. He has called and requested that I assume responsibility for prescribing his psychiatric meds b/c he is tired of paying a high copay to continue seeing his psychiatrist.  ADD: takes adderall just when he is working, 10 mg xr once daily.  This has been stable regarding focus, concentration, impulsivity, fidgeting, hyperactivity, motivation.  Bipolar d/o: doing excellent on lamictal 100mg  qd.  He had irritability/anger/hypomanic behavior, no depressed features.  He has no side effects.   Past Medical History:  Diagnosis Date  . Adult ADHD    managed by psychiatrist  . Bipolar affective disorder (HCC)    managed by psych  . Essential hypertension 2018  . Fatty liver 2010   Noted on CT abd  . Gout    two episodes1 yr apart--big toe.  Never been on preventative meds.  . Nonspecific mesenteric adenitis 2010   Hx of  . Prediabetes 2018   HbA1c 5.9%.    History reviewed. No pertinent surgical history.  Outpatient Medications Prior to Visit  Medication Sig Dispense Refill  . irbesartan (AVAPRO) 150 MG tablet TAKE 1 TABLET (150 MG TOTAL) BY MOUTH DAILY. 30 tablet 5  . amphetamine-dextroamphetamine (ADDERALL XR) 10 MG 24 hr capsule Take 10 mg by mouth daily.    Marland Kitchen. lamoTRIgine (LAMICTAL) 100 MG tablet Take 100 mg by mouth daily.     No facility-administered medications prior to visit.     No Known Allergies  ROS As per HPI  PE: Blood pressure 132/78, pulse 73, temperature 97.9 F (36.6 C), temperature source Oral, resp. rate 16, height 6' 1.5" (1.867 m), weight 290 lb 4 oz (131.7 kg), SpO2 97 %. Wt Readings from Last 2 Encounters:  09/16/16 290 lb 4 oz (131.7 kg)  05/16/16 289 lb 8 oz (131.3 kg)    Gen: alert, oriented x 4, affect pleasant.  Lucid thinking and  conversation noted. HEENT: PERRLA, EOMI.   Neck: no LAD, mass, or thyromegaly. CV: RRR, no m/r/g LUNGS: CTA bilat, nonlabored. NEURO: no tremor or tics noted on observation.  Coordination intact. CN 2-12 grossly intact bilaterally, strength 5/5 in all extremeties.  No ataxia.   LABS:    Chemistry      Component Value Date/Time   NA 139 04/25/2016 0808   K 4.2 04/25/2016 0808   CL 104 04/25/2016 0808   CO2 30 04/25/2016 0808   BUN 10 04/25/2016 0808   CREATININE 1.09 04/25/2016 0808      Component Value Date/Time   CALCIUM 9.6 04/25/2016 0808   ALKPHOS 42 04/25/2016 0808   AST 21 04/25/2016 0808   ALT 27 04/25/2016 0808   BILITOT 0.6 04/25/2016 0808       IMPRESSION AND PLAN:  1) Bipolar affective d/o--most recent episode hypomanic--now in full remission. Continue lamictal 100 mg qd--RF'd today.  2) Adult ADHD: The current medical regimen is effective;  continue present plan and medications. I printed rx's for adderall XR 10mg , 1 tab po qd today for this month, Oct, and Nov 2018.  Appropriate fill on/after date was noted on each rx.  An After Visit Summary was printed and given to the patient.  FOLLOW UP: Return in about 3 months (around 12/16/2016) for routine chronic illness f/u.  Signed:  Michele McalpinePhil  , MD           09/16/2016

## 2016-11-17 ENCOUNTER — Encounter: Payer: Self-pay | Admitting: Family Medicine

## 2016-11-17 ENCOUNTER — Encounter: Payer: Self-pay | Admitting: *Deleted

## 2016-11-17 ENCOUNTER — Ambulatory Visit (INDEPENDENT_AMBULATORY_CARE_PROVIDER_SITE_OTHER): Payer: BLUE CROSS/BLUE SHIELD | Admitting: Family Medicine

## 2016-11-17 ENCOUNTER — Other Ambulatory Visit: Payer: Self-pay

## 2016-11-17 VITALS — BP 141/77 | HR 78 | Temp 98.1°F | Resp 16 | Ht 73.5 in | Wt 293.0 lb

## 2016-11-17 DIAGNOSIS — E669 Obesity, unspecified: Secondary | ICD-10-CM

## 2016-11-17 DIAGNOSIS — I10 Essential (primary) hypertension: Secondary | ICD-10-CM

## 2016-11-17 DIAGNOSIS — R7303 Prediabetes: Secondary | ICD-10-CM

## 2016-11-17 DIAGNOSIS — F909 Attention-deficit hyperactivity disorder, unspecified type: Secondary | ICD-10-CM | POA: Diagnosis not present

## 2016-11-17 LAB — BASIC METABOLIC PANEL
BUN: 9 mg/dL (ref 6–23)
CALCIUM: 10.2 mg/dL (ref 8.4–10.5)
CO2: 29 meq/L (ref 19–32)
CREATININE: 1.11 mg/dL (ref 0.40–1.50)
Chloride: 102 mEq/L (ref 96–112)
GFR: 78.93 mL/min (ref >60.00)
GLUCOSE: 134 mg/dL — AB (ref 70–99)
Potassium: 4.2 mEq/L (ref 3.5–5.1)
Sodium: 139 mEq/L (ref 135–145)

## 2016-11-17 LAB — HEMOGLOBIN A1C: Hgb A1c MFr Bld: 5.6 % (ref 4.6–6.5)

## 2016-11-17 MED ORDER — AMPHETAMINE-DEXTROAMPHET ER 10 MG PO CP24: 10.0000 mg | ORAL_CAPSULE | Freq: Every day | ORAL | 0 refills | Status: DC

## 2016-11-17 NOTE — Progress Notes (Signed)
OFFICE VISIT  11/17/2016   CC:  Chief Complaint  Patient presents with  . Follow-up    RCI, pt is fasting.    HPI:    Patient is a 36 y.o.  male who presents for f/u HTN, IFG/prediabetes, obesity. Also, here for ADHD med f/u.  HTN: 130/75-80 avg.  No side effects from med.   Exercise: goes to [facility] 2 X/week: treadmill and weight. Diet: not working on anything in particular.  Goal wt 280 lbs.  Wants to start with cutting out sodas.  ADHD: doing well on adderall at current dosing--helping well with focus, concentration, level of frustration, goal directed behavior, etc.  No side effects.   Discussed controlled substance contract today: signed and in chart.  Reviewed [place] online controlled substance database and no suspicious activity noted.  Past Medical History:  Diagnosis Date  . Adult ADHD    managed by psychiatrist up until 09/2016  . Bipolar affective disorder (HCC)    managed by psych up until 09/2016  . Essential hypertension 2018  . Fatty liver 2010   Noted on CT abd  . Gout    two episodes1 yr apart--big toe.  Never been on preventative meds.  . Nonspecific mesenteric adenitis 2010   Hx of  . Obesity, Class II, BMI 35-39.9   . Prediabetes 2018   HbA1c 5.9%.    History reviewed. No pertinent surgical history.  Outpatient Medications Prior to Visit  Medication Sig Dispense Refill  . irbesartan (AVAPRO) 150 MG tablet TAKE 1 TABLET (150 MG TOTAL) BY MOUTH DAILY. 30 tablet 5  . lamoTRIgine (LAMICTAL) 100 MG tablet Take 1 tablet (100 mg total) by mouth daily. 90 tablet 3  . amphetamine-dextroamphetamine (ADDERALL XR) 10 MG 24 hr capsule Take 1 capsule (10 mg total) by mouth daily. 30 capsule 0   No facility-administered medications prior to visit.     No Known Allergies  ROS As per HPI  PE: Blood pressure (!) 141/77, pulse 78, temperature 98.1 F (36.7 C), temperature source Oral, resp. rate 16, height 6' 1.5" (1.867 m), weight 293 lb (132.9 kg), SpO2 96 %. Body  mass index is 38.13 kg/m.  Gen: Alert, well appearing.  Patient is oriented to person, place, time, and situation. AFFECT: pleasant, lucid thought and speech. No further exam today.  LABS:  Lab Results  Component Value Date   TSH 1.17 04/25/2016   Lab Results  Component Value Date   WBC 6.8 04/25/2016   HGB 15.4 04/25/2016   HCT 45.2 04/25/2016   MCV 85.9 04/25/2016   PLT 295.0 04/25/2016   Lab Results  Component Value Date   CREATININE 1.09 04/25/2016   BUN 10 04/25/2016   NA 139 04/25/2016   K 4.2 04/25/2016   CL 104 04/25/2016   CO2 30 04/25/2016   Lab Results  Component Value Date   ALT 27 04/25/2016   AST 21 04/25/2016   ALKPHOS 42 04/25/2016   BILITOT 0.6 04/25/2016   Lab Results  Component Value Date   CHOL 198 04/25/2016   Lab Results  Component Value Date   HDL 32.90 (L) 04/25/2016   Lab Results  Component Value Date   LDLCALC 132 (H) 04/25/2016   Lab Results  Component Value Date   TRIG 163.0 (H) 04/25/2016   Lab Results  Component Value Date   CHOLHDL 6 04/25/2016   Lab Results  Component Value Date   HGBA1C 5.9 04/28/2016    IMPRESSION AND PLAN:  1) HTN:  The current medical regimen is effective;  continue present plan and medications. Lytes/cr today.  2) Prediabetes/IFG: discussed diet some, as well as trying to get more aggressive with exercise. Check A1c today.  3) Obesity: discussed some basic dietary changes he can start, increase exercise to 3-4 days per week for now. He stated a goal wt today of 280 lbs.  I think this is a good start.  4) Adult ADHD: The current medical regimen is effective;  continue present plan and medications. I printed rx's for adderall xr 10mg , 1 tab po qd today for Dec 2018, Jan 2019, and Feb 2019.  Appropriate fill on/after date was noted on each rx. Controlled substance contract discussed, signed, and in chart. Will likely do UDS at next f/u in 3 mo.  An After Visit Summary was printed and given  to the patient.  FOLLOW UP: Return in about 3 months (around 02/17/2017) for routine chronic illness f/u.  Signed:  Santiago BumpersPhil , MD           11/17/2016

## 2016-11-21 ENCOUNTER — Encounter: Payer: Self-pay | Admitting: *Deleted

## 2016-11-21 ENCOUNTER — Other Ambulatory Visit: Payer: Self-pay | Admitting: Family Medicine

## 2016-12-24 ENCOUNTER — Encounter: Payer: Self-pay | Admitting: *Deleted

## 2016-12-24 ENCOUNTER — Ambulatory Visit: Payer: BLUE CROSS/BLUE SHIELD | Admitting: Family Medicine

## 2016-12-24 ENCOUNTER — Encounter: Payer: Self-pay | Admitting: Family Medicine

## 2016-12-24 VITALS — BP 141/87 | HR 95 | Temp 98.0°F | Resp 16 | Ht 73.5 in | Wt 293.5 lb

## 2016-12-24 DIAGNOSIS — M109 Gout, unspecified: Secondary | ICD-10-CM | POA: Diagnosis not present

## 2016-12-24 MED ORDER — HYDROCODONE-ACETAMINOPHEN 5-325 MG PO TABS
1.0000 | ORAL_TABLET | Freq: Four times a day (QID) | ORAL | 0 refills | Status: DC | PRN
Start: 2016-12-24 — End: 2017-04-30

## 2016-12-24 MED ORDER — PREDNISONE 20 MG PO TABS: ORAL_TABLET | ORAL | 0 refills | Status: DC

## 2016-12-24 NOTE — Progress Notes (Signed)
OFFICE VISIT  12/24/2016   CC:  Chief Complaint  Patient presents with  . Ankle Pain    left   HPI:    Patient is a 36 y.o. Caucasian male who presents for left foot pain. Onset yesterday morning, very painful, red, swollen.  Gradually worsening of all sx's. No change in diet recently.  No alcohol. No f/c/malaise. NO otc meds tried. Last gout attack was about 2 yrs ago: always L big toe in the past. No recent sprain or trauma.  Past Medical History:  Diagnosis Date  . Adult ADHD    managed by psychiatrist up until 09/2016  . Bipolar affective disorder (HCC)    managed by psych up until 09/2016  . Essential hypertension 2018  . Fatty liver 2010   Noted on CT abd  . Gout    two episodes1 yr apart--big toe.  Never been on preventative meds.  . Nonspecific mesenteric adenitis 2010   Hx of  . Obesity, Class II, BMI 35-39.9   . Prediabetes 2018   HbA1c 5.9%.    History reviewed. No pertinent surgical history.  Outpatient Medications Prior to Visit  Medication Sig Dispense Refill  . amphetamine-dextroamphetamine (ADDERALL XR) 10 MG 24 hr capsule Take 1 capsule (10 mg total) daily by mouth. 30 capsule 0  . irbesartan (AVAPRO) 150 MG tablet TAKE 1 TABLET BY MOUTH EVERY DAY 30 tablet 5  . lamoTRIgine (LAMICTAL) 100 MG tablet Take 1 tablet (100 mg total) by mouth daily. 90 tablet 3   No facility-administered medications prior to visit.     No Known Allergies  ROS As per HPI  PE: Blood pressure (!) 141/87, pulse 95, temperature 98 F (36.7 C), temperature source Oral, resp. rate 16, height 6' 1.5" (1.867 m), weight 293 lb 8 oz (133.1 kg), SpO2 97 %. Gen: Alert, well appearing.  Patient is oriented to person, place, time, and situation. AFFECT: pleasant, lucid thought and speech. Left ankle: decreased ROM due to pain. Mild swelling, erythema, and warmth of anteromedial aspect of ankle.  Exquisitely tender in this region--up to about mid MTP area on 1st and 2nd toes.   Distal to this there is no abnormality or tenderness.  LABS:    Chemistry      Component Value Date/Time   NA 139 11/17/2016 0828   K 4.2 11/17/2016 0828   CL 102 11/17/2016 0828   CO2 29 11/17/2016 0828   BUN 9 11/17/2016 0828   CREATININE 1.11 11/17/2016 0828      Component Value Date/Time   CALCIUM 10.2 11/17/2016 0828   ALKPHOS 42 04/25/2016 0808   AST 21 04/25/2016 0808   ALT 27 04/25/2016 0808   BILITOT 0.6 04/25/2016 0808      IMPRESSION AND PLAN:  Acute gouty arthritis of left ankle. Prednisone 40mg  qd x 5d. Vicodin 5/325, 1-2 q6h prn, #15. Therapeutic expectations and side effect profile of medication discussed today.  Patient's questions answered. He is aware of low purine dietary recommendations. Has very infrequent gout flares, so there is no indication for preventative med at this time.  An After Visit Summary was printed and given to the patient.  FOLLOW UP: Return if symptoms worsen or fail to improve.  Signed:  Santiago BumpersPhil McGowen, MD           12/24/2016

## 2017-02-16 ENCOUNTER — Ambulatory Visit: Payer: BLUE CROSS/BLUE SHIELD | Admitting: Family Medicine

## 2017-03-03 ENCOUNTER — Ambulatory Visit: Payer: BLUE CROSS/BLUE SHIELD | Admitting: Family Medicine

## 2017-03-09 ENCOUNTER — Ambulatory Visit: Payer: BLUE CROSS/BLUE SHIELD | Admitting: Family Medicine

## 2017-03-09 ENCOUNTER — Encounter: Payer: Self-pay | Admitting: Family Medicine

## 2017-03-09 VITALS — BP 126/76 | HR 76 | Resp 16 | Ht 73.5 in | Wt 293.0 lb

## 2017-03-09 DIAGNOSIS — R7303 Prediabetes: Secondary | ICD-10-CM | POA: Diagnosis not present

## 2017-03-09 DIAGNOSIS — F988 Other specified behavioral and emotional disorders with onset usually occurring in childhood and adolescence: Secondary | ICD-10-CM | POA: Diagnosis not present

## 2017-03-09 DIAGNOSIS — Z79899 Other long term (current) drug therapy: Secondary | ICD-10-CM | POA: Diagnosis not present

## 2017-03-09 DIAGNOSIS — I10 Essential (primary) hypertension: Secondary | ICD-10-CM

## 2017-03-09 DIAGNOSIS — E669 Obesity, unspecified: Secondary | ICD-10-CM

## 2017-03-09 MED ORDER — AMPHETAMINE-DEXTROAMPHET ER 10 MG PO CP24: 10.0000 mg | ORAL_CAPSULE | Freq: Every day | ORAL | 0 refills | Status: DC

## 2017-03-09 NOTE — Progress Notes (Signed)
OFFICE VISIT  03/09/2017   CC:  Chief Complaint  Patient presents with  . Follow-up    RCI   HPI:    Patient is a 37 y.o. Caucasian male who presents for f/u HTN, prediabetes/obesity, adult ADD.  HTN: no home monitoring lately.   Has not had his bp med in the last 2 wks due to shortage of the med.  BP check two weeks ago OFF the med was 120s/80s. He does have bp cuff at home.  Prediab/obesity:  Going to GYM daily--cardio and wt's. Diet: no changes.  Adult ADD:  Pt states all is going well with the med at current dosing: much improved focus, concentration, task completion.  Less frustration, better multitasking, less impulsivity and restlessness.  Mood is stable. No side effects from the medication. Most recent adderall dose was yesterday. Nothing else should show up in UDS.  ROS: no CP, no dizziness, no HAs, no SOB, no claudication, no myalgias.  Past Medical History:  Diagnosis Date  . Adult ADHD    managed by psychiatrist up until 09/2016  . Bipolar affective disorder (HCC)    managed by psych up until 09/2016  . Essential hypertension 2018  . Fatty liver 2010   Noted on CT abd  . Gout    two episodes1 yr apart--big toe.  Never been on preventative meds.  . Nonspecific mesenteric adenitis 2010   Hx of  . Obesity, Class II, BMI 35-39.9   . Prediabetes 2018   HbA1c 5.9%.    History reviewed. No pertinent surgical history.  Outpatient Medications Prior to Visit  Medication Sig Dispense Refill  . HYDROcodone-acetaminophen (NORCO/VICODIN) 5-325 MG tablet Take 1-2 tablets by mouth every 6 (six) hours as needed for moderate pain. 15 tablet 0  . irbesartan (AVAPRO) 150 MG tablet TAKE 1 TABLET BY MOUTH EVERY DAY 30 tablet 5  . lamoTRIgine (LAMICTAL) 100 MG tablet Take 1 tablet (100 mg total) by mouth daily. 90 tablet 3  . predniSONE (DELTASONE) 20 MG tablet 2 tabs po qd x 5d 10 tablet 0  . amphetamine-dextroamphetamine (ADDERALL XR) 10 MG 24 hr capsule Take 1 capsule (10  mg total) daily by mouth. 30 capsule 0   No facility-administered medications prior to visit.     No Known Allergies  ROS As per HPI  PE: Blood pressure 126/76, pulse 76, resp. rate 16, height 6' 1.5" (1.867 m), weight 293 lb (132.9 kg), SpO2 98 %. Wt Readings from Last 2 Encounters:  03/09/17 293 lb (132.9 kg)  12/24/16 293 lb 8 oz (133.1 kg)    Gen: alert, oriented x 4, affect pleasant.  Lucid thinking and conversation noted. HEENT: PERRLA, EOMI.   Neck: no LAD, mass, or thyromegaly. CV: RRR, no m/r/g LUNGS: CTA bilat, nonlabored. NEURO: no tremor or tics noted on observation.  Coordination intact. CN 2-12 grossly intact bilaterally, strength 5/5 in all extremeties.  No ataxia.   LABS:  Lab Results  Component Value Date   TSH 1.17 04/25/2016   Lab Results  Component Value Date   WBC 6.8 04/25/2016   HGB 15.4 04/25/2016   HCT 45.2 04/25/2016   MCV 85.9 04/25/2016   PLT 295.0 04/25/2016   Lab Results  Component Value Date   CREATININE 1.11 11/17/2016   BUN 9 11/17/2016   NA 139 11/17/2016   K 4.2 11/17/2016   CL 102 11/17/2016   CO2 29 11/17/2016   Lab Results  Component Value Date   ALT 27 04/25/2016  AST 21 04/25/2016   ALKPHOS 42 04/25/2016   BILITOT 0.6 04/25/2016   Lab Results  Component Value Date   CHOL 198 04/25/2016   Lab Results  Component Value Date   HDL 32.90 (L) 04/25/2016   Lab Results  Component Value Date   LDLCALC 132 (H) 04/25/2016   Lab Results  Component Value Date   TRIG 163.0 (H) 04/25/2016   Lab Results  Component Value Date   CHOLHDL 6 04/25/2016   Lab Results  Component Value Date   HGBA1C 5.6 11/17/2016    IMPRESSION AND PLAN:  1) HTN: doing well off meds x 3 wks, but not enough home monitoring data at this time. He'll stay off irbesartan for now, check bp qd x 2-3 weeks and call our office with this info and we'll decide at that time about restart of bp med.  No lab today.  2) Prediabetes/obesity: doing  great with exercise but essentially doing nothing regarding diet. Reiterated the importance of starting to change dietary habits and he expressed understanding. Last A1c 4 mo ago was 5.6%.  No repeat today.  3) Adult ADD.  The current medical regimen is effective;  continue present plan and medications. I printed rx's for adderall xr 10mg , 1 qd, #30, today for this month, April 2019, and May 2019.  Appropriate fill on/after date was noted on each rx. CSC up to date. UDS obtained today.  An After Visit Summary was printed and given to the patient.  FOLLOW UP: Return for annual CPE (fasting)--no later than 05/04/17..--per his insurer requirement.  Signed:  Santiago BumpersPhil , MD           03/09/2017

## 2017-03-10 LAB — PAIN MGMT, PROFILE 8 W/CONF, U
6 ACETYLMORPHINE: NEGATIVE ng/mL (ref <10)
Alcohol Metabolites: NEGATIVE ng/mL (ref <500)
Amphetamines: NEGATIVE ng/mL (ref <500)
Benzodiazepines: NEGATIVE ng/mL (ref <100)
Buprenorphine, Urine: NEGATIVE ng/mL (ref <5)
CREATININE: 83.1 mg/dL
Cocaine Metabolite: NEGATIVE ng/mL (ref <150)
MARIJUANA METABOLITE: NEGATIVE ng/mL (ref <20)
MDMA: NEGATIVE ng/mL (ref <500)
OPIATES: NEGATIVE ng/mL (ref <100)
Oxidant: NEGATIVE ug/mL (ref <200)
Oxycodone: NEGATIVE ng/mL (ref <100)
PH: 6.33 (ref 4.5–9.0)

## 2017-03-11 ENCOUNTER — Other Ambulatory Visit: Payer: BLUE CROSS/BLUE SHIELD

## 2017-03-11 ENCOUNTER — Other Ambulatory Visit: Payer: Self-pay | Admitting: Family Medicine

## 2017-03-11 DIAGNOSIS — Z79899 Other long term (current) drug therapy: Secondary | ICD-10-CM

## 2017-03-15 LAB — PAIN MGMT, PROFILE 8 W/CONF, U
6 Acetylmorphine: NEGATIVE ng/mL (ref <10)
ALCOHOL METABOLITES: NEGATIVE ng/mL (ref <500)
Amphetamine: 826 ng/mL — ABNORMAL HIGH (ref <250)
Amphetamines: POSITIVE ng/mL — AB (ref <500)
Benzodiazepines: NEGATIVE ng/mL (ref <100)
Buprenorphine, Urine: NEGATIVE ng/mL (ref <5)
COCAINE METABOLITE: NEGATIVE ng/mL (ref <150)
CREATININE: 179.1 mg/dL
MDMA: NEGATIVE ng/mL (ref <500)
METHAMPHETAMINE: NEGATIVE ng/mL (ref <250)
Marijuana Metabolite: NEGATIVE ng/mL (ref <20)
OXIDANT: NEGATIVE ug/mL (ref <200)
OXYCODONE: NEGATIVE ng/mL (ref <100)
Opiates: NEGATIVE ng/mL (ref <100)
PH: 6.98 (ref 4.5–9.0)

## 2017-04-06 DIAGNOSIS — E785 Hyperlipidemia, unspecified: Secondary | ICD-10-CM

## 2017-04-06 HISTORY — DX: Hyperlipidemia, unspecified: E78.5

## 2017-04-29 ENCOUNTER — Encounter: Payer: Self-pay | Admitting: Family Medicine

## 2017-04-29 ENCOUNTER — Encounter: Payer: BLUE CROSS/BLUE SHIELD | Admitting: Family Medicine

## 2017-04-29 NOTE — Progress Notes (Deleted)
Office Note 04/29/2017  CC: No chief complaint on file.   HPI:  Erik Davis is a 37 y.o. male who is here for annual health maintenance exam.   Past Medical History:  Diagnosis Date  . Adult ADHD    managed by psychiatrist up until 09/2016  . Bipolar affective disorder (HCC)    managed by psych up until 09/2016  . Essential hypertension 2018  . Fatty liver 2010   Noted on CT abd  . Gout    two episodes1 yr apart--big toe.  Never been on preventative meds.  . Nonspecific mesenteric adenitis 2010   Hx of  . Obesity, Class II, BMI 35-39.9   . Prediabetes 2018   HbA1c 5.9%.    No past surgical history on file.  Family History  Problem Relation Age of Onset  . Mental retardation Father   . Depression Maternal Grandmother   . Cancer Paternal Grandmother        Lung?    Social History   Socioeconomic History  . Marital status: Married    Spouse name: Not on file  . Number of children: Not on file  . Years of education: Not on file  . Highest education level: Not on file  Occupational History  . Not on file  Social Needs  . Financial resource strain: Not on file  . Food insecurity:    Worry: Not on file    Inability: Not on file  . Transportation needs:    Medical: Not on file    Non-medical: Not on file  Tobacco Use  . Smoking status: Former Smoker    Packs/day: 0.50    Years: 7.00    Pack years: 3.50    Types: Cigarettes    Last attempt to quit: 01/07/2004    Years since quitting: 13.3  . Smokeless tobacco: Never Used  Substance and Sexual Activity  . Alcohol use: Yes    Comment: rare - 2-3 beers a year  . Drug use: No  . Sexual activity: Not on file  Lifestyle  . Physical activity:    Days per week: Not on file    Minutes per session: Not on file  . Stress: Not on file  Relationships  . Social connections:    Talks on phone: Not on file    Gets together: Not on file    Attends religious service: Not on file    Active member of club or  organization: Not on file    Attends meetings of clubs or organizations: Not on file    Relationship status: Not on file  . Intimate partner violence:    Fear of current or ex partner: Not on file    Emotionally abused: Not on file    Physically abused: Not on file    Forced sexual activity: Not on file  Other Topics Concern  . Not on file  Social History Narrative   Single, two teenage sons.   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   No current tobacco: 5 yr pack hx--quit 2006.   No alcohol.   No hx of prob with alc or drugs.    Outpatient Medications Prior to Visit  Medication Sig Dispense Refill  . amphetamine-dextroamphetamine (ADDERALL XR) 10 MG 24 hr capsule Take 1 capsule (10 mg total) by mouth daily. 30 capsule 0  . HYDROcodone-acetaminophen (NORCO/VICODIN) 5-325 MG tablet Take 1-2 tablets by mouth every 6 (six) hours as needed for moderate pain. 15  tablet 0  . irbesartan (AVAPRO) 150 MG tablet TAKE 1 TABLET BY MOUTH EVERY DAY 30 tablet 5  . lamoTRIgine (LAMICTAL) 100 MG tablet Take 1 tablet (100 mg total) by mouth daily. 90 tablet 3  . predniSONE (DELTASONE) 20 MG tablet 2 tabs po qd x 5d 10 tablet 0   No facility-administered medications prior to visit.     No Known Allergies  ROS *** PE; There were no vitals taken for this visit. *** Pertinent labs:  Lab Results  Component Value Date   TSH 1.17 04/25/2016   Lab Results  Component Value Date   WBC 6.8 04/25/2016   HGB 15.4 04/25/2016   HCT 45.2 04/25/2016   MCV 85.9 04/25/2016   PLT 295.0 04/25/2016   Lab Results  Component Value Date   CREATININE 1.11 11/17/2016   BUN 9 11/17/2016   NA 139 11/17/2016   K 4.2 11/17/2016   CL 102 11/17/2016   CO2 29 11/17/2016   Lab Results  Component Value Date   ALT 27 04/25/2016   AST 21 04/25/2016   ALKPHOS 42 04/25/2016   BILITOT 0.6 04/25/2016   Lab Results  Component Value Date   CHOL 198 04/25/2016   Lab Results  Component Value  Date   HDL 32.90 (L) 04/25/2016   Lab Results  Component Value Date   LDLCALC 132 (H) 04/25/2016   Lab Results  Component Value Date   TRIG 163.0 (H) 04/25/2016   Lab Results  Component Value Date   CHOLHDL 6 04/25/2016   Lab Results  Component Value Date   HGBA1C 5.6 11/17/2016    ASSESSMENT AND PLAN:   Health maintenance exam: Reviewed age and gender appropriate health maintenance issues (prudent diet, regular exercise, health risks of tobacco and excessive alcohol, use of seatbelts, fire alarms in home, use of sunscreen).  Also reviewed age and gender appropriate health screening as well as vaccine recommendations. Vaccines: UTD. Labs: fasting HP labs today. (HIV screen?). Prostate ca screening: average risk patient= start screening at age 38 yrs. Colon ca screening:average risk patient= start screening at age 37 yrs.   An After Visit Summary was printed and given to the patient.   FOLLOW UP:  No follow-ups on file.  Signed:  Santiago BumpersPhil McGowen, MD           04/29/2017

## 2017-04-30 ENCOUNTER — Encounter: Payer: Self-pay | Admitting: Family Medicine

## 2017-04-30 ENCOUNTER — Ambulatory Visit (INDEPENDENT_AMBULATORY_CARE_PROVIDER_SITE_OTHER): Payer: BLUE CROSS/BLUE SHIELD | Admitting: Family Medicine

## 2017-04-30 VITALS — BP 136/95 | HR 83 | Temp 98.1°F | Resp 16 | Ht 73.5 in | Wt 291.0 lb

## 2017-04-30 DIAGNOSIS — I10 Essential (primary) hypertension: Secondary | ICD-10-CM | POA: Diagnosis not present

## 2017-04-30 DIAGNOSIS — R7301 Impaired fasting glucose: Secondary | ICD-10-CM

## 2017-04-30 DIAGNOSIS — Z0000 Encounter for general adult medical examination without abnormal findings: Secondary | ICD-10-CM | POA: Diagnosis not present

## 2017-04-30 DIAGNOSIS — Z79899 Other long term (current) drug therapy: Secondary | ICD-10-CM | POA: Diagnosis not present

## 2017-04-30 DIAGNOSIS — E7800 Pure hypercholesterolemia, unspecified: Secondary | ICD-10-CM | POA: Diagnosis not present

## 2017-04-30 LAB — COMPREHENSIVE METABOLIC PANEL
ALBUMIN: 4.6 g/dL (ref 3.5–5.2)
ALT: 26 U/L (ref 0–53)
AST: 21 U/L (ref 0–37)
Alkaline Phosphatase: 49 U/L (ref 39–117)
BUN: 12 mg/dL (ref 6–23)
CALCIUM: 9.7 mg/dL (ref 8.4–10.5)
CHLORIDE: 103 meq/L (ref 96–112)
CO2: 30 mEq/L (ref 19–32)
Creatinine, Ser: 1.09 mg/dL (ref 0.40–1.50)
GFR: 80.41 mL/min (ref >60.00)
Glucose, Bld: 94 mg/dL (ref 70–99)
POTASSIUM: 4.1 meq/L (ref 3.5–5.1)
Sodium: 139 mEq/L (ref 135–145)
Total Bilirubin: 0.7 mg/dL (ref 0.2–1.2)
Total Protein: 7.3 g/dL (ref 6.0–8.3)

## 2017-04-30 LAB — CBC WITH DIFFERENTIAL/PLATELET
BASOS ABS: 0.1 10*3/uL (ref 0.0–0.1)
Basophils Relative: 0.7 % (ref 0.0–3.0)
EOS ABS: 0.1 10*3/uL (ref 0.0–0.7)
Eosinophils Relative: 2 % (ref 0.0–5.0)
HEMATOCRIT: 43.3 % (ref 39.0–52.0)
HEMOGLOBIN: 14.8 g/dL (ref 13.0–17.0)
Lymphocytes Relative: 41.9 % (ref 12.0–46.0)
Lymphs Abs: 2.9 10*3/uL (ref 0.7–4.0)
MCHC: 34.1 g/dL (ref 30.0–36.0)
MCV: 85.8 fl (ref 78.0–100.0)
MONOS PCT: 8.5 % (ref 3.0–12.0)
Monocytes Absolute: 0.6 10*3/uL (ref 0.1–1.0)
Neutro Abs: 3.3 10*3/uL (ref 1.4–7.7)
Neutrophils Relative %: 46.9 % (ref 43.0–77.0)
Platelets: 261 10*3/uL (ref 150.0–400.0)
RBC: 5.05 Mil/uL (ref 4.22–5.81)
RDW: 13 % (ref 11.5–15.5)
WBC: 7 10*3/uL (ref 4.0–10.5)

## 2017-04-30 LAB — LIPID PANEL
CHOL/HDL RATIO: 6
CHOLESTEROL: 198 mg/dL (ref 0–200)
HDL: 32.1 mg/dL — AB (ref >39.00)
LDL CALC: 127 mg/dL — AB (ref 0–99)
NonHDL: 165.68
TRIGLYCERIDES: 195 mg/dL — AB (ref 0.0–149.0)
VLDL: 39 mg/dL (ref 0.0–40.0)

## 2017-04-30 LAB — TSH: TSH: 3.72 u[IU]/mL (ref 0.35–4.50)

## 2017-04-30 MED ORDER — TELMISARTAN 20 MG PO TABS: 20.0000 mg | ORAL_TABLET | Freq: Every day | ORAL | 1 refills | Status: DC

## 2017-04-30 NOTE — Patient Instructions (Signed)

## 2017-04-30 NOTE — Progress Notes (Signed)
Office Note 04/30/2017  CC:  Chief Complaint  Patient presents with  . Annual Exam    HPI:  Erik Davis is a 37 y.o. White male who is here for annual health maintenance exam. Feeling well, no acute complaints.  Exercise: lifting wts occasionally.  No cardio. Diet: working on stopping sodas. Dental: preventatives UTD. Eyes: just got glasses.  He has been off his bp med for the last 2 mo or so--as per our plan. Occ bp monitoring shows syst upper 130s and diastolics consistently 90s. HR 70s.   Past Medical History:  Diagnosis Date  . Adult ADHD    managed by psychiatrist up until 09/2016  . Bipolar affective disorder (HCC)    managed by psych up until 09/2016  . Essential hypertension 2018  . Fatty liver 2010   Noted on CT abd  . Gout    two episodes1 yr apart--big toe.  Never been on preventative meds.  . Nonspecific mesenteric adenitis 2010   Hx of  . Obesity, Class II, BMI 35-39.9   . Prediabetes 2018   HbA1c 5.9%.    History reviewed. No pertinent surgical history.  Family History  Problem Relation Age of Onset  . Mental retardation Father   . Depression Maternal Grandmother   . Cancer Paternal Grandmother        Lung?    Social History   Socioeconomic History  . Marital status: Married    Spouse name: Not on file  . Number of children: Not on file  . Years of education: Not on file  . Highest education level: Not on file  Occupational History  . Not on file  Social Needs  . Financial resource strain: Not on file  . Food insecurity:    Worry: Not on file    Inability: Not on file  . Transportation needs:    Medical: Not on file    Non-medical: Not on file  Tobacco Use  . Smoking status: Former Smoker    Packs/day: 0.50    Years: 7.00    Pack years: 3.50    Types: Cigarettes    Last attempt to quit: 01/07/2004    Years since quitting: 13.3  . Smokeless tobacco: Never Used  Substance and Sexual Activity  . Alcohol use: Yes    Comment:  rare - 2-3 beers a year  . Drug use: No  . Sexual activity: Not on file  Lifestyle  . Physical activity:    Days per week: Not on file    Minutes per session: Not on file  . Stress: Not on file  Relationships  . Social connections:    Talks on phone: Not on file    Gets together: Not on file    Attends religious service: Not on file    Active member of club or organization: Not on file    Attends meetings of clubs or organizations: Not on file    Relationship status: Not on file  . Intimate partner violence:    Fear of current or ex partner: Not on file    Emotionally abused: Not on file    Physically abused: Not on file    Forced sexual activity: Not on file  Other Topics Concern  . Not on file  Social History Narrative   Single, two teenage sons.   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   No current tobacco: 5 yr pack hx--quit 2006.   No alcohol.   No  hx of prob with alc or drugs.   Pt not taking irbesartan, prednisone, or vicodin listed below. Outpatient Medications Prior to Visit  Medication Sig Dispense Refill  . amphetamine-dextroamphetamine (ADDERALL XR) 10 MG 24 hr capsule Take 1 capsule (10 mg total) by mouth daily. 30 capsule 0  . lamoTRIgine (LAMICTAL) 100 MG tablet Take 1 tablet (100 mg total) by mouth daily. 90 tablet 3  . HYDROcodone-acetaminophen (NORCO/VICODIN) 5-325 MG tablet Take 1-2 tablets by mouth every 6 (six) hours as needed for moderate pain. (Patient not taking: Reported on 04/30/2017) 15 tablet 0  . irbesartan (AVAPRO) 150 MG tablet TAKE 1 TABLET BY MOUTH EVERY DAY (Patient not taking: Reported on 04/30/2017) 30 tablet 5  . predniSONE (DELTASONE) 20 MG tablet 2 tabs po qd x 5d (Patient not taking: Reported on 04/30/2017) 10 tablet 0   No facility-administered medications prior to visit.     No Known Allergies  ROS Review of Systems  Constitutional: Negative for appetite change, chills, fatigue and fever.  HENT: Negative for  congestion, dental problem, ear pain and sore throat.   Eyes: Negative for discharge, redness and visual disturbance.  Respiratory: Negative for cough, chest tightness, shortness of breath and wheezing.   Cardiovascular: Negative for chest pain, palpitations and leg swelling.  Gastrointestinal: Negative for abdominal pain, blood in stool, diarrhea, nausea and vomiting.  Genitourinary: Negative for difficulty urinating, dysuria, flank pain, frequency, hematuria and urgency.  Musculoskeletal: Negative for arthralgias, back pain, joint swelling, myalgias and neck stiffness.  Skin: Negative for pallor and rash.  Neurological: Negative for dizziness, speech difficulty, weakness and headaches.  Hematological: Negative for adenopathy. Does not bruise/bleed easily.  Psychiatric/Behavioral: Negative for confusion and sleep disturbance. The patient is not nervous/anxious.     PE; Blood pressure (!) 136/95, pulse 83, temperature 98.1 F (36.7 C), temperature source Oral, resp. rate 16, height 6' 1.5" (1.867 m), weight 291 lb (132 kg), SpO2 97 %. Body mass index is 37.87 kg/m.  Gen: Alert, well appearing.  Patient is oriented to person, place, time, and situation. AFFECT: pleasant, lucid thought and speech. ENT: Ears: EACs clear, normal epithelium.  TMs with good light reflex and landmarks bilaterally.  Eyes: no injection, icteris, swelling, or exudate.  EOMI, PERRLA. Nose: no drainage or turbinate edema/swelling.  No injection or focal lesion.  Mouth: lips without lesion/swelling.  Oral mucosa pink and moist.  Missing many teeth, and the teeth he does have appear brittle, eroded, discolored.  No gingival swelling.  Oropharynx without erythema, exudate, or swelling.  Neck: supple/nontender.  No LAD, mass, or TM.  Carotid pulses 2+ bilaterally, without bruits. CV: RRR, no m/r/g.   LUNGS: CTA bilat, nonlabored resps, good aeration in all lung fields. ABD: soft, NT, ND, BS normal.  No hepatospenomegaly or  mass.  No bruits. EXT: no clubbing, cyanosis, or edema.  Musculoskeletal: no joint swelling, erythema, warmth, or tenderness.  ROM of all joints intact. Skin - no sores or suspicious lesions or rashes or color changes. Many tattoos all over pt's body that he states were all professionally done.   Pertinent labs:  Lab Results  Component Value Date   TSH 1.17 04/25/2016   Lab Results  Component Value Date   WBC 6.8 04/25/2016   HGB 15.4 04/25/2016   HCT 45.2 04/25/2016   MCV 85.9 04/25/2016   PLT 295.0 04/25/2016   Lab Results  Component Value Date   CREATININE 1.11 11/17/2016   BUN 9 11/17/2016   NA 139 11/17/2016  K 4.2 11/17/2016   CL 102 11/17/2016   CO2 29 11/17/2016   Lab Results  Component Value Date   ALT 27 04/25/2016   AST 21 04/25/2016   ALKPHOS 42 04/25/2016   BILITOT 0.6 04/25/2016   Lab Results  Component Value Date   CHOL 198 04/25/2016   Lab Results  Component Value Date   HDL 32.90 (L) 04/25/2016   Lab Results  Component Value Date   LDLCALC 132 (H) 04/25/2016   Lab Results  Component Value Date   TRIG 163.0 (H) 04/25/2016   Lab Results  Component Value Date   CHOLHDL 6 04/25/2016   Lab Results  Component Value Date   HGBA1C 5.6 11/17/2016    ASSESSMENT AND PLAN:   1) HTN: he does need to restart bp med. Since irbesartan is not available reliably lately, will change med to telmisartan 20mg  qd. If pt doing good on this at next f/u in 6 wks, will start doing 90d supplies.  2) Health maintenance exam: Reviewed age and gender appropriate health maintenance issues (prudent diet, regular exercise, health risks of tobacco and excessive alcohol, use of seatbelts, fire alarms in home, use of sunscreen).  Also reviewed age and gender appropriate health screening as well as vaccine recommendations. Vaccines: all UTD. Labs: fasting HP today.  Pt declined HIV screen. Prostate ca screening: average risk patient= start screening at age 28  yrs. Colon ca screening: average risk patient= start screening at age 65 yrs.  An After Visit Summary was printed and given to the patient.  FOLLOW UP:  Return in about 6 weeks (around 06/11/2017) for ADD/HTN f/u.  Signed:  Santiago Bumpers, MD           04/30/2017

## 2017-06-11 ENCOUNTER — Ambulatory Visit: Payer: BLUE CROSS/BLUE SHIELD | Admitting: Family Medicine

## 2017-06-11 ENCOUNTER — Other Ambulatory Visit: Payer: Self-pay | Admitting: *Deleted

## 2017-06-11 NOTE — Telephone Encounter (Signed)
RF request for adderall LOV: 04/30/17 Next ov: 06/22/17 - had apt today (06/11/17) Last written: 03/09/17 #30 w/ 0RF (given Rx for March, April and May)  Please advise. Thanks.

## 2017-06-14 ENCOUNTER — Other Ambulatory Visit: Payer: Self-pay | Admitting: Family Medicine

## 2017-06-14 MED ORDER — AMPHETAMINE-DEXTROAMPHET ER 10 MG PO CP24: 10.0000 mg | ORAL_CAPSULE | Freq: Every day | ORAL | 0 refills | Status: DC

## 2017-06-15 NOTE — Telephone Encounter (Signed)
Rx eRxed on 06/14/17 at 7:15pm.  Left detailed message on cell vm, okay per DPR.

## 2017-06-22 ENCOUNTER — Ambulatory Visit: Payer: BLUE CROSS/BLUE SHIELD | Admitting: Family Medicine

## 2017-08-03 ENCOUNTER — Ambulatory Visit: Payer: BLUE CROSS/BLUE SHIELD | Admitting: Family Medicine

## 2017-08-04 ENCOUNTER — Encounter: Payer: Self-pay | Admitting: Family Medicine

## 2017-08-04 ENCOUNTER — Ambulatory Visit: Payer: BLUE CROSS/BLUE SHIELD | Admitting: Family Medicine

## 2017-08-04 VITALS — BP 138/86 | HR 78 | Resp 16 | Ht 73.5 in | Wt 288.0 lb

## 2017-08-04 DIAGNOSIS — F988 Other specified behavioral and emotional disorders with onset usually occurring in childhood and adolescence: Secondary | ICD-10-CM

## 2017-08-04 DIAGNOSIS — F3170 Bipolar disorder, currently in remission, most recent episode unspecified: Secondary | ICD-10-CM | POA: Diagnosis not present

## 2017-08-04 DIAGNOSIS — I10 Essential (primary) hypertension: Secondary | ICD-10-CM

## 2017-08-04 MED ORDER — AMPHETAMINE-DEXTROAMPHET ER 10 MG PO CP24: 10.0000 mg | ORAL_CAPSULE | Freq: Every day | ORAL | 0 refills | Status: DC

## 2017-08-04 NOTE — Progress Notes (Signed)
OFFICE VISIT  08/04/2017   CC:  Chief Complaint  Patient presents with  . Follow-up    RCI/refills     HPI:    Patient is a 37 y.o. Caucasian male who presents for 3 mo f/u adult ADD, bipolar disorder that has been in remission, and HTN.  Says telmisartan made him feel bad, orthostatic dizziness, took it for a couple months.  Has not taken it for 6-8 wks. BP avg since being off 120s to 130/low 80s. Has a lot more energy off med.  Does workouts in GYM daily---cardio.  Eating low Na diet.  ADD: Pt states all is going well with the med at current dosing: much improved focus, concentration, task completion.  Less frustration, better multitasking, less impulsivity and restlessness.  Mood is stable. No side effects from the medication.   Mood: stable mood, functioning well.  Anxiety not a big issue. No side effect from med.  ROS: no CP, no SOB, no wheezing, no cough, no dizziness, no HAs, no rashes, no melena/hematochezia.  No polyuria or polydipsia.  No myalgias or arthralgias.   Past Medical History:  Diagnosis Date  . Adult ADHD    managed by psychiatrist up until 09/2016  . Bipolar affective disorder (HCC)    managed by psych up until 09/2016  . Borderline hyperlipidemia 04/2017  . Essential hypertension 2018  . Fatty liver 2010   Noted on CT abd  . Gout    two episodes1 yr apart--big toe.  Never been on preventative meds.  . Nonspecific mesenteric adenitis 2010   Hx of  . Obesity, Class II, BMI 35-39.9   . Prediabetes 2018   HbA1c 5.9%.    History reviewed. No pertinent surgical history.  Outpatient Medications Prior to Visit  Medication Sig Dispense Refill  . lamoTRIgine (LAMICTAL) 100 MG tablet Take 1 tablet (100 mg total) by mouth daily. 90 tablet 3  . amphetamine-dextroamphetamine (ADDERALL XR) 10 MG 24 hr capsule Take 1 capsule (10 mg total) by mouth daily. 30 capsule 0  . telmisartan (MICARDIS) 20 MG tablet Take 1 tablet (20 mg total) by mouth daily. (Patient  not taking: Reported on 08/04/2017) 30 tablet 1   No facility-administered medications prior to visit.     No Known Allergies  ROS As per HPI  XB:[id number]:Recheck manual bp today 138/86 Blood pressure 138/86, pulse 78, resp. rate 16, height 6' 1.5" (1.867 m), weight 288 lb (130.6 kg), SpO2 98 %. Wt Readings from Last 2 Encounters:  08/04/17 288 lb (130.6 kg)  04/30/17 291 lb (132 kg)    Gen: alert, oriented x 4, affect pleasant.  Lucid thinking and conversation noted. HEENT: PERRLA, EOMI.   Neck: no LAD, mass, or thyromegaly. CV: RRR, no m/r/g LUNGS: CTA bilat, nonlabored. NEURO: no tremor or tics noted on observation.  Coordination intact. CN 2-12 grossly intact bilaterally, strength 5/5 in all extremeties.  No ataxia.   LABS:  Lab Results  Component Value Date   TSH 3.72 04/30/2017   Lab Results  Component Value Date   WBC 7.0 04/30/2017   HGB 14.8 04/30/2017   HCT 43.3 04/30/2017   MCV 85.8 04/30/2017   PLT 261.0 04/30/2017   Lab Results  Component Value Date   CREATININE 1.09 04/30/2017   BUN 12 04/30/2017   NA 139 04/30/2017   K 4.1 04/30/2017   CL 103 04/30/2017   CO2 30 04/30/2017   Lab Results  Component Value Date   ALT 26 04/30/2017  AST 21 04/30/2017   ALKPHOS 49 04/30/2017   BILITOT 0.7 04/30/2017   Lab Results  Component Value Date   CHOL 198 04/30/2017   Lab Results  Component Value Date   HDL 32.10 (L) 04/30/2017   Lab Results  Component Value Date   LDLCALC 127 (H) 04/30/2017   Lab Results  Component Value Date   TRIG 195.0 (H) 04/30/2017   Lab Results  Component Value Date   CHOLHDL 6 04/30/2017   Lab Results  Component Value Date   HGBA1C 5.6 11/17/2016    IMPRESSION AND PLAN:  1) Adult ADD: The current medical regimen is effective;  continue present plan and medications. I did erx's for Sept 2019 and Oct 2019 (he still has one for Aug 2019) today.  Appropriate fill on/after date was noted on each rx.  2) HTN: borderline  numbers OFF BP MED.  Feels good.  Working out daily and eating better diet. Will see how he continues to do OFF med. Instructions: Check bp and HR daily for the next 1 week, call these numbers in to my nurse please.  3) Bipolar d/o in remission: The current medical regimen is effective;  continue present plan and medications.  An After Visit Summary was printed and given to the patient.  FOLLOW UP: Return in about 3 months (around 11/04/2017) for routine chronic illness f/u.  Signed:  Santiago Bumpers, MD           08/04/2017

## 2017-08-04 NOTE — Patient Instructions (Addendum)
Check bp and HR daily for the next 1 week, call these numbers in to my nurse please.

## 2017-10-28 ENCOUNTER — Ambulatory Visit: Payer: Managed Care, Other (non HMO) | Admitting: Family Medicine

## 2017-10-28 ENCOUNTER — Encounter: Payer: Self-pay | Admitting: Family Medicine

## 2017-10-28 VITALS — BP 128/82 | HR 78 | Resp 16 | Ht 73.5 in | Wt 293.0 lb

## 2017-10-28 DIAGNOSIS — Z23 Encounter for immunization: Secondary | ICD-10-CM

## 2017-10-28 DIAGNOSIS — F988 Other specified behavioral and emotional disorders with onset usually occurring in childhood and adolescence: Secondary | ICD-10-CM | POA: Diagnosis not present

## 2017-10-28 DIAGNOSIS — F3170 Bipolar disorder, currently in remission, most recent episode unspecified: Secondary | ICD-10-CM | POA: Diagnosis not present

## 2017-10-28 MED ORDER — AMPHETAMINE-DEXTROAMPHET ER 10 MG PO CP24: 10.0000 mg | ORAL_CAPSULE | Freq: Every day | ORAL | 0 refills | Status: DC

## 2017-10-28 MED ORDER — LAMOTRIGINE 100 MG PO TABS: 100.0000 mg | ORAL_TABLET | Freq: Every day | ORAL | 1 refills | Status: DC

## 2017-10-28 NOTE — Progress Notes (Signed)
OFFICE VISIT  10/28/2017   CC:  Chief Complaint  Patient presents with  . Follow-up    rci   HPI:    Patient is a 37 y.o. Caucasian male who presents for f/u adult ADHD and bipolar disorder. Also, hx of HTN.  OFF of meds last visit his home bp's were mostly normal but some borderline high. Home bp 120-130 over low 80. He has changed diet and is exercising regularly.  Takes lamictal daily: mood is stable, no side effects.  Takes adderall only on work days.  He has not filed his October rx for adderall yet. Pt states all is going well with the med at current dosing: much improved focus, concentration, task completion.  Less frustration, better multitasking, less impulsivity and restlessness.  Mood is stable. No side effects from the medication.   Past Medical History:  Diagnosis Date  . Adult ADHD    managed by psychiatrist up until 09/2016  . Bipolar affective disorder (HCC)    managed by psych up until 09/2016  . Borderline hyperlipidemia 04/2017  . Essential hypertension 2018  . Fatty liver 2010   Noted on CT abd  . Gout    two episodes1 yr apart--big toe.  Never been on preventative meds.  . Nonspecific mesenteric adenitis 2010   Hx of  . Obesity, Class II, BMI 35-39.9   . Prediabetes 2018   HbA1c 5.9%.    History reviewed. No pertinent surgical history.  Outpatient Medications Prior to Visit  Medication Sig Dispense Refill  . amphetamine-dextroamphetamine (ADDERALL XR) 10 MG 24 hr capsule Take 1 capsule (10 mg total) by mouth daily. 30 capsule 0  . lamoTRIgine (LAMICTAL) 100 MG tablet Take 1 tablet (100 mg total) by mouth daily. 90 tablet 3   No facility-administered medications prior to visit.     No Known Allergies  ROS As per HPI  PE: Blood pressure 128/82, pulse 78, resp. rate 16, height 6' 1.5" (1.867 m), weight 293 lb (132.9 kg), SpO2 98 %. Wt Readings from Last 2 Encounters:  10/28/17 293 lb (132.9 kg)  08/04/17 288 lb (130.6 kg)    Gen:  alert, oriented x 4, affect pleasant.  Lucid thinking and conversation noted. HEENT: PERRLA, EOMI.   Neck: no LAD, mass, or thyromegaly. CV: RRR, no m/r/g LUNGS: CTA bilat, nonlabored. NEURO: no tremor or tics noted on observation.  Coordination intact. CN 2-12 grossly intact bilaterally, strength 5/5 in all extremeties.  No ataxia. LL's w/out edema.  LABS:  Lab Results  Component Value Date   TSH 3.72 04/30/2017   Lab Results  Component Value Date   WBC 7.0 04/30/2017   HGB 14.8 04/30/2017   HCT 43.3 04/30/2017   MCV 85.8 04/30/2017   PLT 261.0 04/30/2017   Lab Results  Component Value Date   CREATININE 1.09 04/30/2017   BUN 12 04/30/2017   NA 139 04/30/2017   K 4.1 04/30/2017   CL 103 04/30/2017   CO2 30 04/30/2017   Lab Results  Component Value Date   ALT 26 04/30/2017   AST 21 04/30/2017   ALKPHOS 49 04/30/2017   BILITOT 0.7 04/30/2017   Lab Results  Component Value Date   CHOL 198 04/30/2017   Lab Results  Component Value Date   HDL 32.10 (L) 04/30/2017   Lab Results  Component Value Date   LDLCALC 127 (H) 04/30/2017   Lab Results  Component Value Date   TRIG 195.0 (H) 04/30/2017   Lab Results  Component Value Date   CHOLHDL 6 04/30/2017   Lab Results  Component Value Date   HGBA1C 5.6 11/17/2016    IMPRESSION AND PLAN:  1) Adult ADD: The current medical regimen is effective;  continue present plan and medications. I did electronic rx's for adderall XR 10mg  qAM, #30 today for Nov and Dec 2019 (he still as Oct rx at pharmacy to fill any day now).  Appropriate fill on/after date was noted on each rx.  2) Bipolar d/o, in full remission: continue lamictal 100 mg qd.  An After Visit Summary was printed and given to the patient.  FOLLOW UP: Return in about 3 months (around 01/28/2018) for routine chronic illness f/u.  Signed:  Santiago Bumpers, MD           10/28/2017

## 2017-10-28 NOTE — Addendum Note (Signed)
Addended by: Regan Rakers on: 10/28/2017 08:41 AM   Modules accepted: Orders

## 2017-11-04 ENCOUNTER — Ambulatory Visit: Payer: Self-pay

## 2017-11-04 ENCOUNTER — Ambulatory Visit: Payer: Managed Care, Other (non HMO) | Admitting: Family Medicine

## 2017-11-04 NOTE — Telephone Encounter (Signed)
Pt calling with right hand swelling with severe pain. Pt stated his thumb is so swollen he cannot move it. The fingers and palm are also swollen. Pt stated there is a red streak that goes from the base of the hand that goes up half the forearm. Pt states pain is severe. Pt initially thought it was gout but pt stated he has never had it in his hand before. Pt stated this is the first time he had hand swelling. Pt denies fever.  Pt advised to go to [facility]. Pt stated he will go. Care advice given and pt verbalized understanding.   Reason for Disposition . [1] Red area or streak [2] large (> 2 in. or 5 cm)  Answer Assessment - Initial Assessment Questions 1. ONSET: "When did the swelling start?" (e.g., minutes, hours, days)     Sunday 2. LOCATION: "What part of the hand is swollen?"  "Are both hands swollen or just one hand?"     Right hand  And the thumb is so swollen than he is unable to move it and the fingers and palm area aree swollen- red streak starts to base of hand that goes up half of forearm 3. SEVERITY: "How bad is the swelling?" (e.g., localized; mild, moderate, severe)   - BALL OR LUMP: small ball or lump   - LOCALIZED: puffy or swollen area or patch of skin   - JOINT SWELLING: swelling of a joint   - MILD: puffiness or mild swelling of fingers or hand   - MODERATE: fingers and hand are swollen   - SEVERE: swelling of entire hand and up into forearm     moderate 4. REDNESS: "Does the swelling look red or infected?"     Red streak from base of hand that goes up half way to forearm 5. PAIN: "Is the swelling painful to touch?" If so, ask: "How painful is it?"   (Scale 1-10; mild, moderate or severe)     Yes- 10/10  6. FEVER: "Do you have a fever?" If so, ask: "What is it, how was it measured, and when did it start?"      no 7. CAUSE: "What do you think is causing the hand swelling?" (e.g., heat, insect bite, pregnancy, recent injury)     Thought was gout but has never gotten it in his  hand before 8. MEDICAL HISTORY: "Do you have a history of heart failure, kidney disease, liver failure, or cancer?"     no 9. RECURRENT SYMPTOM: "Have you had hand swelling before?" If so, ask: "When was the last time?" "What happened that time?"     no 10. OTHER SYMPTOMS: "Do you have any other symptoms?" (e.g., blurred vision, difficulty breathing, headache)       no 11. PREGNANCY: "Is there any chance you are pregnant?" "When was your last menstrual period?"       n/a  Protocols used: HAND [facility]

## 2017-11-04 NOTE — Telephone Encounter (Signed)
Noted  

## 2017-11-05 DIAGNOSIS — L03011 Cellulitis of right finger: Secondary | ICD-10-CM | POA: Insufficient documentation

## 2017-11-05 HISTORY — DX: Cellulitis of right finger: L03.011

## 2017-11-06 DIAGNOSIS — L03019 Cellulitis of unspecified finger: Secondary | ICD-10-CM | POA: Insufficient documentation

## 2017-11-06 HISTORY — DX: Cellulitis of unspecified finger: L03.019

## 2017-11-12 DIAGNOSIS — M130 Polyarthritis, unspecified: Secondary | ICD-10-CM | POA: Insufficient documentation

## 2018-01-29 ENCOUNTER — Ambulatory Visit: Payer: Managed Care, Other (non HMO) | Admitting: Family Medicine

## 2018-01-29 DIAGNOSIS — Z0289 Encounter for other administrative examinations: Secondary | ICD-10-CM

## 2018-01-29 NOTE — Progress Notes (Deleted)
OFFICE VISIT  01/29/2018   CC: No chief complaint on file.    HPI:    Patient is a 38 y.o. Caucasian male who presents for 3 mo f/u bipolar disorder and adult adhd. He also has hx of HTN and has been treated with meds in the past but was able to get off meds with TLC.  Bipolar:  ADD:  HTN:  Past Medical History:  Diagnosis Date  . Adult ADHD    managed by psychiatrist up until 09/2016  . Bipolar affective disorder (HCC)    managed by psych up until 09/2016  . Borderline hyperlipidemia 04/2017  . Essential hypertension 2018  . Fatty liver 2010   Noted on CT abd  . Gout    two episodes1 yr apart--big toe.  Never been on preventative meds.  . Nonspecific mesenteric adenitis 2010   Hx of  . Obesity, Class II, BMI 35-39.9   . Prediabetes 2018   HbA1c 5.9%.    No past surgical history on file.  Outpatient Medications Prior to Visit  Medication Sig Dispense Refill  . amphetamine-dextroamphetamine (ADDERALL XR) 10 MG 24 hr capsule Take 1 capsule (10 mg total) by mouth daily. 30 capsule 0  . lamoTRIgine (LAMICTAL) 100 MG tablet Take 1 tablet (100 mg total) by mouth daily. 90 tablet 1   No facility-administered medications prior to visit.     No Known Allergies  ROS As per HPI  PE: There were no vitals taken for this visit. ***  LABS:  Lab Results  Component Value Date   TSH 3.72 04/30/2017   Lab Results  Component Value Date   WBC 7.0 04/30/2017   HGB 14.8 04/30/2017   HCT 43.3 04/30/2017   MCV 85.8 04/30/2017   PLT 261.0 04/30/2017   Lab Results  Component Value Date   CREATININE 1.09 04/30/2017   BUN 12 04/30/2017   NA 139 04/30/2017   K 4.1 04/30/2017   CL 103 04/30/2017   CO2 30 04/30/2017   Lab Results  Component Value Date   ALT 26 04/30/2017   AST 21 04/30/2017   ALKPHOS 49 04/30/2017   BILITOT 0.7 04/30/2017   Lab Results  Component Value Date   CHOL 198 04/30/2017   Lab Results  Component Value Date   HDL 32.10 (L) 04/30/2017    Lab Results  Component Value Date   LDLCALC 127 (H) 04/30/2017   Lab Results  Component Value Date   TRIG 195.0 (H) 04/30/2017   Lab Results  Component Value Date   CHOLHDL 6 04/30/2017   Lab Results  Component Value Date   HGBA1C 5.6 11/17/2016    IMPRESSION AND PLAN:  No problem-specific Assessment & Plan notes found for this encounter.  No labs needed.  An After Visit Summary was printed and given to the patient.  FOLLOW UP: No follow-ups on file.3 mo cpe  Signed:  Santiago Bumpers, MD           01/29/2018

## 2018-03-05 ENCOUNTER — Ambulatory Visit: Payer: Managed Care, Other (non HMO) | Admitting: Family Medicine

## 2018-03-12 ENCOUNTER — Ambulatory Visit: Payer: Managed Care, Other (non HMO) | Admitting: Family Medicine

## 2018-03-12 NOTE — Progress Notes (Deleted)
OFFICE VISIT  03/12/2018   CC: No chief complaint on file.    HPI:    Patient is a 37 y.o. Caucasian male who presents for 3 mo f/u adult ADHD, bipolar disorder. Has hx of HTN, prediabetes, HLD and fatty liver--on no meds at this time for these conditions.  ADHD:  Mood:  Past Medical History:  Diagnosis Date  . Adult ADHD    managed by psychiatrist up until 09/2016  . Bipolar affective disorder (HCC)    managed by psych up until 09/2016  . Borderline hyperlipidemia 04/2017  . Essential hypertension 2018  . Fatty liver 2010   Noted on CT abd  . Gout    two episodes1 yr apart--big toe.  Never been on preventative meds.  . Nonspecific mesenteric adenitis 2010   Hx of  . Obesity, Class II, BMI 35-39.9   . Prediabetes 2018   HbA1c 5.9%.    No past surgical history on file.  Outpatient Medications Prior to Visit  Medication Sig Dispense Refill  . amphetamine-dextroamphetamine (ADDERALL XR) 10 MG 24 hr capsule Take 1 capsule (10 mg total) by mouth daily. 30 capsule 0  . lamoTRIgine (LAMICTAL) 100 MG tablet Take 1 tablet (100 mg total) by mouth daily. 90 tablet 1   No facility-administered medications prior to visit.     No Known Allergies  ROS As per HPI  PE: There were no vitals taken for this visit. ***  LABS:    Chemistry      Component Value Date/Time   NA 139 04/30/2017 1117   K 4.1 04/30/2017 1117   CL 103 04/30/2017 1117   CO2 30 04/30/2017 1117   BUN 12 04/30/2017 1117   CREATININE 1.09 04/30/2017 1117      Component Value Date/Time   CALCIUM 9.7 04/30/2017 1117   ALKPHOS 49 04/30/2017 1117   AST 21 04/30/2017 1117   ALT 26 04/30/2017 1117   BILITOT 0.7 04/30/2017 1117     Lab Results  Component Value Date   CHOL 198 04/30/2017   HDL 32.10 (L) 04/30/2017   LDLCALC 127 (H) 04/30/2017   TRIG 195.0 (H) 04/30/2017   CHOLHDL 6 04/30/2017   Lab Results  Component Value Date   HGBA1C 5.6 11/17/2016   Lab Results  Component Value Date   WBC 7.0 04/30/2017   HGB 14.8 04/30/2017   HCT 43.3 04/30/2017   MCV 85.8 04/30/2017   PLT 261.0 04/30/2017   Lab Results  Component Value Date   TSH 3.72 04/30/2017    IMPRESSION AND PLAN:  No problem-specific Assessment & Plan notes found for this encounter.   An After Visit Summary was printed and given to the patient.  FOLLOW UP: No follow-ups on file.CPE 3 mo (+labs)  Signed:  Santiago Bumpers, MD           03/12/2018

## 2018-03-16 ENCOUNTER — Encounter: Payer: Self-pay | Admitting: Family Medicine

## 2018-08-12 ENCOUNTER — Other Ambulatory Visit: Payer: Self-pay

## 2018-08-12 ENCOUNTER — Telehealth: Payer: Self-pay | Admitting: Family Medicine

## 2018-08-12 ENCOUNTER — Encounter: Payer: Self-pay | Admitting: Family Medicine

## 2018-08-12 ENCOUNTER — Ambulatory Visit: Payer: Managed Care, Other (non HMO) | Admitting: Family Medicine

## 2018-08-12 VITALS — BP 143/84 | HR 82 | Temp 98.6°F | Resp 16 | Ht 73.5 in | Wt 292.0 lb

## 2018-08-12 DIAGNOSIS — M130 Polyarthritis, unspecified: Secondary | ICD-10-CM

## 2018-08-12 DIAGNOSIS — M25649 Stiffness of unspecified hand, not elsewhere classified: Secondary | ICD-10-CM

## 2018-08-12 DIAGNOSIS — F988 Other specified behavioral and emotional disorders with onset usually occurring in childhood and adolescence: Secondary | ICD-10-CM

## 2018-08-12 DIAGNOSIS — M79642 Pain in left hand: Secondary | ICD-10-CM

## 2018-08-12 DIAGNOSIS — E669 Obesity, unspecified: Secondary | ICD-10-CM

## 2018-08-12 DIAGNOSIS — M79641 Pain in right hand: Secondary | ICD-10-CM

## 2018-08-12 DIAGNOSIS — F3170 Bipolar disorder, currently in remission, most recent episode unspecified: Secondary | ICD-10-CM

## 2018-08-12 MED ORDER — LAMOTRIGINE 100 MG PO TABS: 100.0000 mg | ORAL_TABLET | Freq: Every day | ORAL | 1 refills | Status: DC

## 2018-08-12 MED ORDER — PREDNISONE 10 MG PO TABS: ORAL_TABLET | ORAL | 0 refills | Status: DC

## 2018-08-12 MED ORDER — AMPHETAMINE-DEXTROAMPHET ER 10 MG PO CP24: 10.0000 mg | ORAL_CAPSULE | Freq: Every day | ORAL | 0 refills | Status: DC

## 2018-08-12 NOTE — Telephone Encounter (Signed)
Pls call pt and tell him I forgot to get blood work on him when he was in office today (08/12/18) and I'd like him to come back at earliest convenience for (nonfasting) blood work--orders are entered.-thx

## 2018-08-12 NOTE — Progress Notes (Signed)
OFFICE VISIT  08/12/2018   CC:  Chief Complaint  Patient presents with  . Follow-up    RCI, pt is not fasting     HPI:    Patient is a 38 y.o. Caucasian male who presents for f/u adult ADD and bipolar d/o. PMP AWARE reviewed today: most recent adderall xr filled 01/09/2018, rx by me.  No suspicious activity. CSC needs updating.  He has not followed up for ADHD/bipolar b/c of covid 19 pandemic/quarantine restrictions. He ran out of adderall 01/2018 and is about to run out of lamictal. States that mood has been stable, and he does want to restart adderall xr 45m qd b/c it helped well with his adhd.  Right after I saw him back in oct 2019 he developed joint pain and stiffness-->both hands (particularly thumbs, R>>L), MCPs location.  Also L knee and ankle.  Says he was so stiff he could barely walk in the mornings.  The pain was bad.  [facility] [place] [place] [facility] [place] [facility] [place]   Some joint swelling was noted, particularly R thumb, and some streaking redness of R thumb was noted.  He was dx'd with R thumb cellulitis and ?bacteria and was given an infusion of clindamycin.  This did not help anything.  Pt with hx of gout but ED MD's did not think it was gout.  Suspicion-->RA or other inflammatory arthritis. On the 3rd visit to the ED he got 10 mg IV decadron and said it worked [employer] QUICKLY->"I walked out of the ED a new man".  He took a prednisone taper after that and says ALL joint c/o remained gone until about 3 mo ago when he felt return of mild pain and [facility] and [facility] and index and middle finger pain and stiffness in MCPs.  Morning is the worst, stiffness x 1.5-2 hours.   Otherwise was feeling well-->return of stiffness in MCPs of thumbs and fingers 2-4 bilat.  All other joints feel fine.  Aleve for a while--helped a little but not enough to continue taking it.    In the ED back in Oct 2019 an extensive lab eval showed mild  elevation of ESR, significant elevation of CRP, but otherwise negative/normal results (Rh factor, CCP Ab, ANA, Lyme dz titers, GC/Chlamydia, HIV, Hep A/B/C, uric acid level.  No joint fluid was obtained for evaluation.  X-ray of ankle did not show any effusion or bony erosion. No other x-rays were done.  ROS: no fevers, night sweats, or abnl weight loss.No CP, no SOB, no wheezing, no cough, no dizziness, no HAs, no rashes, no melena/hematochezia.  No polyuria or polydipsia.  No myalgias or arthralgias. No oral ulcers, no redness or pain of eyes.     Past Medical History:  Diagnosis Date  . Adult ADHD    managed by psychiatrist up until 09/2016  . Bipolar affective disorder ([place]ph[place]    managed by psych up until 09/2016  . Borderline hyperlipidemia 04/2017  . Essential hypertension 2018  . Fatty liver 2010   Noted on CT abd  . Gout    two episodes1 yr apart--big toe.  Never been on preventative meds.  . Nonspecific mesenteric adenitis 2010   Hx of  . Obesity, Class II, BMI 35-39.9   . Prediabetes 2018   HbA1c 5.9%.    History reviewed. No pertinent surgical history.  Outpatient Medications Prior to Visit  Medication Sig Dispense Refill  .  amphetamine-dextroamphetamine (ADDERALL XR) 10 MG 24 hr capsule Take 1 capsule (10 mg total) by mouth daily. 30 capsule 0  . lamoTRIgine (LAMICTAL) 100 MG tablet Take 1 tablet (100 mg total) by mouth daily. 90 tablet 1   No facility-administered medications prior to visit.     No Known Allergies  ROS As per HPI  PE: Blood pressure (!) 143/84, pulse 82, temperature 98.6 F (37 C), temperature source Temporal, resp. rate 16, height 6' 1.5" (1.867 m), weight 292 lb (132.5 kg), SpO2 95 %. Wt Readings from Last 2 Encounters:  08/12/18 292 lb (132.5 kg)  10/28/17 293 lb (132.9 kg)    Gen: alert, oriented x 4, affect pleasant.  Lucid thinking and conversation noted. HEENT: PERRLA, EOMI.  No injection/erythema of eyes.  Mouth w/out ulcers. Neck:  no LAD, mass, or thyromegaly. CV: RRR, no m/r/g LUNGS: CTA bilat, nonlabored. ABD: soft, NT/ND EXT: no clubbing or cyanosis.  no edema.  NEURO: no tremor or tics noted on observation.  Coordination intact. CN 2-12 grossly intact bilaterally, strength 5/5 in all extremeties.  No ataxia. Musculoskeletal: no muscle/soft tissue tenderness or swelling. No joint swelling or erythema or warmth. He has mild tenderness to palpation over bilat 1st CMC and MCP joints as well as the MCP joints of both index and both middle fingers.  All other joints w/out tenderness.  Fingers ROM intact.  Nails normal. Skin - no sores or suspicious lesions or rashes or color changes   LABS:  Lab Results  Component Value Date   TSH 3.72 04/30/2017   Lab Results  Component Value Date   WBC 7.0 04/30/2017   HGB 14.8 04/30/2017   HCT 43.3 04/30/2017   MCV 85.8 04/30/2017   PLT 261.0 04/30/2017   Lab Results  Component Value Date   CREATININE 1.09 04/30/2017   BUN 12 04/30/2017   NA 139 04/30/2017   K 4.1 04/30/2017   CL 103 04/30/2017   CO2 30 04/30/2017   Lab Results  Component Value Date   ALT 26 04/30/2017   AST 21 04/30/2017   ALKPHOS 49 04/30/2017   BILITOT 0.7 04/30/2017   Lab Results  Component Value Date   CHOL 198 04/30/2017   Lab Results  Component Value Date   HDL 32.10 (L) 04/30/2017   Lab Results  Component Value Date   LDLCALC 127 (H) 04/30/2017   Lab Results  Component Value Date   TRIG 195.0 (H) 04/30/2017   Lab Results  Component Value Date   CHOLHDL 6 04/30/2017   Lab Results  Component Value Date   HGBA1C 5.6 11/17/2016    IMPRESSION AND PLAN:  1) Adult ADHD: restart adderall xr 10 mg qd.   I did electronic rx's for adderall xr, 1 tab po qd, #30 today for this month, Sept, and Oct 2020.  Appropriate fill on/after date was noted on each rx.  2) Bipolar d/o: long term stability on lamictal 100 mg qd.  Continue/RF today.  3) Arthritis: suspicious for Rh factor  neg RA. Check plain films of R hand. Recheck CBC, CMET, ESR/CRP, uric acid. Prednisone taper: step down in 5d intervals->40-30-20-10. Refer to rheum after labs/imaging returns.  We'll need to make sure they get his [place] ED records that have all his lab results from the w/u for inflammatory arthritides (Oct and Nov 2019).  An After Visit Summary was printed and given to the patient.  FOLLOW UP: Return in about 3 months (around 11/12/2018) for annual CPE (fasting).  Signed:  Crissie Sickles, MD           08/12/2018

## 2018-08-13 NOTE — Telephone Encounter (Signed)
LM for pt to return call to schedule lab visit, he does not need to be fasting.

## 2018-08-13 NOTE — Telephone Encounter (Signed)
LM for pt to return call to schedule for lab visit.

## 2018-08-23 NOTE — Telephone Encounter (Signed)
Left VM for patient to CB to schedule lab visit

## 2020-02-20 ENCOUNTER — Ambulatory Visit (INDEPENDENT_AMBULATORY_CARE_PROVIDER_SITE_OTHER): Payer: BC Managed Care – PPO | Admitting: Family Medicine

## 2020-02-20 ENCOUNTER — Other Ambulatory Visit: Payer: Self-pay

## 2020-02-20 VITALS — BP 156/85 | HR 85 | Temp 98.0°F | Resp 16 | Ht 73.5 in | Wt 307.8 lb

## 2020-02-20 DIAGNOSIS — M1009 Idiopathic gout, multiple sites: Secondary | ICD-10-CM | POA: Diagnosis not present

## 2020-02-20 MED ORDER — ALLOPURINOL 100 MG PO TABS: ORAL_TABLET | ORAL | 6 refills | Status: DC

## 2020-02-20 MED ORDER — PREDNISONE 10 MG PO TABS: ORAL_TABLET | ORAL | 0 refills | Status: DC

## 2020-02-20 NOTE — Progress Notes (Signed)
OFFICE VISIT  02/20/2020  CC:  Chief Complaint  Patient presents with  . Gout flare    Occurring every 2 weeks for the past 3 months, radiating to different area of the body every time. 6 flare up since September   HPI:    Patient is a 40 y.o. Caucasian male who presents for "gout flare". Last week had R elbow pain, diffuse redness, and swelling x 3d, acute and w/out preceding strain/overuse/injury-> advil, tylenol  -->says now elbow is completely back to normal.  Over the last year he reports MUCH more problem with gout arthritis flares: R knee, R ankle, R elbow, L knee, R great toe.  Describes each episode the same: red, swollen joint w/out preceding trauma or injury.    All episodes went away with rest and a few days of advil. No known dietary trigger identified. Does not drink alcohol. Has never been on prophylactic gout med.  Of note, hx of bipolar affective d/o and adult ADD and says he went off his meds since I last saw him a couple years ago and all has been stable.  ROS: no fevers, no CP, no SOB, no wheezing, no cough, no dizziness, no HAs, no rashes, no melena/hematochezia.  No polyuria or polydipsia.  No myalgias.  No focal weakness, paresthesias, or tremors.  No acute vision or hearing abnormalities. No n/v/d or abd pain.  No palpitations.     Past Medical History:  Diagnosis Date  . Adult ADHD    managed by psychiatrist up until 09/2016  . Bipolar affective disorder (HCC)    managed by psych up until 09/2016  . Borderline hyperlipidemia 04/2017  . Essential hypertension 2018  . Fatty liver 2010   Noted on CT abd  . Gout    two episodes1 yr apart--big toe.  Never been on preventative meds.  . Nonspecific mesenteric adenitis 2010   Hx of  . Obesity, Class II, BMI 35-39.9   . Prediabetes 2018   HbA1c 5.9%.    No past surgical history on file.  Outpatient Medications Prior to Visit  Medication Sig Dispense Refill  . amphetamine-dextroamphetamine (ADDERALL XR)  10 MG 24 hr capsule Take 1 capsule (10 mg total) by mouth daily. (Patient not taking: Reported on 02/20/2020) 30 capsule 0  . lamoTRIgine (LAMICTAL) 100 MG tablet Take 1 tablet (100 mg total) by mouth daily. (Patient not taking: Reported on 02/20/2020) 90 tablet 1  . predniSONE (DELTASONE) 10 MG tablet 4 tabs po qd x 5d, then 3 tabs po qd x 5d, then 2 tab po qd x 5d, then 1 tab po qd x 5d (Patient not taking: Reported on 02/20/2020) 50 tablet 0   No facility-administered medications prior to visit.    No Known Allergies  ROS As per HPI  PE: Vitals with BMI 02/20/2020 08/12/2018 10/28/2017  Height 6' 1.5" 6' 1.5" 6' 1.5"  Weight 307 lbs 13 oz 292 lbs 293 lbs  BMI 40.05 38 38.13  Systolic 156 143 937  Diastolic 85 84 82  Pulse 85 82 78     Gen: Alert, well appearing.  Patient is oriented to person, place, time, and situation. AFFECT: pleasant, lucid thought and speech. Musculoskeletal: no joint swelling, erythema, warmth, or tenderness.  ROM of all joints intact.   LABS:  Lab Results  Component Value Date   TSH 3.72 04/30/2017   Lab Results  Component Value Date   WBC 7.0 04/30/2017   HGB 14.8 04/30/2017   HCT 43.3  04/30/2017   MCV 85.8 04/30/2017   PLT 261.0 04/30/2017   Lab Results  Component Value Date   CREATININE 1.09 04/30/2017   BUN 12 04/30/2017   NA 139 04/30/2017   K 4.1 04/30/2017   CL 103 04/30/2017   CO2 30 04/30/2017   Lab Results  Component Value Date   ALT 26 04/30/2017   AST 21 04/30/2017   ALKPHOS 49 04/30/2017   BILITOT 0.7 04/30/2017   Lab Results  Component Value Date   CHOL 198 04/30/2017   Lab Results  Component Value Date   HDL 32.10 (L) 04/30/2017   Lab Results  Component Value Date   LDLCALC 127 (H) 04/30/2017   Lab Results  Component Value Date   TRIG 195.0 (H) 04/30/2017   Lab Results  Component Value Date   CHOLHDL 6 04/30/2017   Lab Results  Component Value Date   HGBA1C 5.6 11/17/2016   IMPRESSION AND PLAN:  1)  Gouty arthritis, multiple flares, no identified trigger as far as food goes. Most recent flare, R elbow, completely resolved now. After initially being only R MTP jt, in the last year it has hit his R knee, R ankle, R elbow, and L knee. Check CBC, BMET, and uric acid level today. Start allopurinol 100mg  qd, inc to 200mg  qd in 7d. Prednisone 20mg  qd x 7d, then 10mg  qd x 7d to mitigate risk of flare while getting allopurinol started. Return for lab visit in 2 wks to recheck uric acid level.  2) History of bipolar II and adult ADD: he is stable/doing well off of meds for these conditions.  An After Visit Summary was printed and given to the patient.  FOLLOW UP: Return in about 6 months (around 08/19/2020) for routine chronic illness f/u.  Also needs non-fasting lab appt in 2 wks for recheck uric acid.  Signed:  , MD           02/20/2020

## 2020-02-20 NOTE — Patient Instructions (Addendum)
Allopurinol: 1 tab daily for 7d then increase to 2 tabs daily.   Low-Purine Eating Plan A low-purine eating plan involves making food choices to limit your intake of purine. Purine is a kind of uric acid. Too much uric acid in your blood can cause certain conditions, such as gout and kidney stones. Eating a low-purine diet can help control these conditions. What are tips for following this plan? Reading food labels  Avoid foods with saturated or Trans fat.  Check the ingredient list of grains-based foods, such as bread and cereal, to make sure that they contain whole grains.  Check the ingredient list of sauces or soups to make sure they do not contain meat or fish.  When choosing soft drinks, check the ingredient list to make sure they do not contain high-fructose corn syrup. Shopping  Buy plenty of fresh fruits and vegetables.  Avoid buying canned or fresh fish.  Buy dairy products labeled as low-fat or nonfat.  Avoid buying premade or processed foods. These foods are often high in fat, salt (sodium), and added sugar.   Cooking  Use olive oil instead of butter when cooking. Oils like olive oil, canola oil, and sunflower oil contain healthy fats. Meal planning  Learn which foods do or do not affect you. If you find out that a food tends to cause your gout symptoms to flare up, avoid eating that food. You can enjoy foods that do not cause problems. If you have any questions about a food item, talk with your dietitian or health care provider.  Limit foods high in fat, especially saturated fat. Fat makes it harder for your body to get rid of uric acid.  Choose foods that are lower in fat and are lean sources of protein. General guidelines  Limit alcohol intake to no more than 1 drink a day for nonpregnant women and 2 drinks a day for men. One drink equals 12 oz of beer, 5 oz of wine, or 1 oz of hard liquor. Alcohol can affect the way your body gets rid of uric acid.  Drink plenty  of water to keep your urine clear or pale yellow. Fluids can help remove uric acid from your body.  If directed by your health care provider, take a vitamin C supplement.  Work with your health care provider and dietitian to develop a plan to achieve or maintain a healthy weight. Losing weight can help reduce uric acid in your blood. What foods are recommended? The items listed may not be a complete list. Talk with your dietitian about what dietary choices are best for you. Foods low in purines Foods low in purines do not need to be limited. These include:  All fruits.  All low-purine vegetables, pickles, and olives.  Breads, pasta, rice, cornbread, and popcorn. Cake and other baked goods.  All dairy foods.  Eggs, nuts, and nut butters.  Spices and condiments, such as salt, herbs, and vinegar.  Plant oils, butter, and margarine.  Water, sugar-free soft drinks, tea, coffee, and cocoa.  Vegetable-based soups, broths, sauces, and gravies. Foods moderate in purines Foods moderate in purines should be limited to the amounts listed.   cup of asparagus, cauliflower, spinach, mushrooms, or green peas, each day.  2/3 cup uncooked oatmeal, each day.   cup dry wheat bran or wheat germ, each day.  2-3 ounces of meat or poultry, each day.  4-6 ounces of shellfish, such as crab, lobster, oysters, or shrimp, each day.  1 cup cooked  beans, peas, or lentils, each day.  Soup, broths, or bouillon made from meat or fish. Limit these foods as much as possible. What foods are not recommended? The items listed may not be a complete list. Talk with your dietitian about what dietary choices are best for you. Limit your intake of foods high in purines, including:  Beer and other alcohol.  Meat-based gravy or sauce.  Canned or fresh fish, such as: ? Anchovies, sardines, herring, and tuna. ? Mussels and scallops. ? Codfish, trout, and haddock.  Tomasa Blase.  Organ meats, such as: ? Liver  or kidney. ? Tripe. ? Sweetbreads (thymus gland or pancreas).  Wild Education officer, environmental.  Yeast or yeast extract supplements.  Drinks sweetened with high-fructose corn syrup. Summary  Eating a low-purine diet can help control conditions caused by too much uric acid in the body, such as gout or kidney stones.  Choose low-purine foods, limit alcohol, and limit foods high in fat.  You will learn over time which foods do or do not affect you. If you find out that a food tends to cause your gout symptoms to flare up, avoid eating that food. This information is not intended to replace advice given to you by your health care provider. Make sure you discuss any questions you have with your health care provider. Document Revised: 04/07/2019 Document Reviewed: 04/07/2019 Elsevier Patient Education  2021 [employer].

## 2020-02-21 LAB — BASIC METABOLIC PANEL
BUN: 9 mg/dL (ref 6–23)
CO2: 26 mEq/L (ref 19–32)
Calcium: 9.2 mg/dL (ref 8.4–10.5)
Chloride: 101 mEq/L (ref 96–112)
Creatinine, Ser: 0.99 mg/dL (ref 0.40–1.50)
GFR: 94.99 mL/min (ref >60.00)
Glucose, Bld: 225 mg/dL — ABNORMAL HIGH (ref 70–99)
Potassium: 4 mEq/L (ref 3.5–5.1)
Sodium: 136 mEq/L (ref 135–145)

## 2020-02-21 LAB — CBC WITH DIFFERENTIAL/PLATELET
Basophils Absolute: 0 10*3/uL (ref 0.0–0.1)
Basophils Relative: 0.9 % (ref 0.0–3.0)
Eosinophils Absolute: 0.1 10*3/uL (ref 0.0–0.7)
Eosinophils Relative: 1.8 % (ref 0.0–5.0)
HCT: 41.7 % (ref 39.0–52.0)
Hemoglobin: 14.3 g/dL (ref 13.0–17.0)
Lymphocytes Relative: 40.8 % (ref 12.0–46.0)
Lymphs Abs: 2.1 10*3/uL (ref 0.7–4.0)
MCHC: 34.4 g/dL (ref 30.0–36.0)
MCV: 84.4 fl (ref 78.0–100.0)
Monocytes Absolute: 0.2 10*3/uL (ref 0.1–1.0)
Monocytes Relative: 4.9 % (ref 3.0–12.0)
Neutro Abs: 2.6 10*3/uL (ref 1.4–7.7)
Neutrophils Relative %: 51.6 % (ref 43.0–77.0)
Platelets: 318 10*3/uL (ref 150.0–400.0)
RBC: 4.94 Mil/uL (ref 4.22–5.81)
RDW: 12.5 % (ref 11.5–15.5)
WBC: 5 10*3/uL (ref 4.0–10.5)

## 2020-02-21 LAB — URIC ACID: Uric Acid, Serum: 9.3 mg/dL — ABNORMAL HIGH (ref 4.0–7.8)

## 2020-02-22 ENCOUNTER — Other Ambulatory Visit (INDEPENDENT_AMBULATORY_CARE_PROVIDER_SITE_OTHER): Payer: BC Managed Care – PPO

## 2020-02-22 ENCOUNTER — Encounter: Payer: Self-pay | Admitting: Family Medicine

## 2020-02-22 DIAGNOSIS — R739 Hyperglycemia, unspecified: Secondary | ICD-10-CM | POA: Diagnosis not present

## 2020-02-22 LAB — HEMOGLOBIN A1C: Hgb A1c MFr Bld: 6.4 % (ref 4.6–6.5)

## 2020-03-05 ENCOUNTER — Ambulatory Visit: Payer: BC Managed Care – PPO

## 2020-03-07 ENCOUNTER — Ambulatory Visit: Payer: BC Managed Care – PPO | Admitting: Family Medicine

## 2020-03-16 ENCOUNTER — Other Ambulatory Visit: Payer: Self-pay

## 2020-03-16 ENCOUNTER — Ambulatory Visit: Payer: BC Managed Care – PPO

## 2020-03-16 ENCOUNTER — Ambulatory Visit (INDEPENDENT_AMBULATORY_CARE_PROVIDER_SITE_OTHER): Payer: BC Managed Care – PPO

## 2020-03-16 DIAGNOSIS — M1009 Idiopathic gout, multiple sites: Secondary | ICD-10-CM | POA: Diagnosis not present

## 2020-03-16 LAB — URIC ACID: Uric Acid, Serum: 6.6 mg/dL (ref 4.0–7.8)

## 2020-03-19 ENCOUNTER — Other Ambulatory Visit: Payer: Self-pay

## 2020-03-20 ENCOUNTER — Encounter: Payer: Self-pay | Admitting: Family Medicine

## 2020-03-20 ENCOUNTER — Ambulatory Visit: Payer: BC Managed Care – PPO | Admitting: Family Medicine

## 2020-03-20 VITALS — BP 130/88 | HR 76 | Temp 98.1°F | Resp 16 | Ht 73.5 in | Wt 312.2 lb

## 2020-03-20 DIAGNOSIS — E119 Type 2 diabetes mellitus without complications: Secondary | ICD-10-CM | POA: Diagnosis not present

## 2020-03-20 DIAGNOSIS — M1009 Idiopathic gout, multiple sites: Secondary | ICD-10-CM | POA: Diagnosis not present

## 2020-03-20 DIAGNOSIS — F3181 Bipolar II disorder: Secondary | ICD-10-CM

## 2020-03-20 MED ORDER — METFORMIN HCL 500 MG PO TABS: 500.0000 mg | ORAL_TABLET | Freq: Two times a day (BID) | ORAL | 3 refills | Status: DC

## 2020-03-20 MED ORDER — LAMOTRIGINE 25 MG PO TABS: ORAL_TABLET | ORAL | 0 refills | Status: DC

## 2020-03-20 NOTE — Progress Notes (Signed)
OFFICE VISIT  03/20/2020  CC:  Chief Complaint  Patient presents with  . Follow-up    Gout, DM   HPI:    Patient is a 40 y.o. Caucasian male who presents for 1 mo f/u gout and new dx DM 2. A/P as of last visit: "1) Gouty arthritis, multiple flares, no identified trigger as far as food goes. Most recent flare, R elbow, completely resolved now. After initially being only R MTP jt, in the last year it has hit his R knee, R ankle, R elbow, and L knee. Check CBC, BMET, and uric acid level today. Start allopurinol 100mg  qd, inc to 200mg  qd in 7d. Prednisone 20mg  qd x 7d, then 10mg  qd x 7d to mitigate risk of flare while getting allopurinol started. Return for lab visit in 2 wks to recheck uric acid level.  2) History of bipolar II and adult ADD: he is stable/doing well off of meds for these conditions."  INTERIM HX: Doing well, no joint pains at all.  Gout: uric acid level 9.3 one mo ago when allopurinol started.  Recheck a few days ago was 6.6. Tolerating med well.  DM 2: Last visit 1 mo ago his labs came back (unexpectedly) glucose 225, Hba1c 6.4%. He is open to starting metformin and seeing a nutritionist.  He asks to get back on lamictal now, says last few months people have commented to him that he seems more irritable, less easy going, makes hasty decisions easier, more angry.  No outright manic behavior, no depression.   He identifies with an overall feeling of being "stressed" but no outside stressors are changed or new recently.  ROS as above, plus--> no fevers, no CP, no SOB, no wheezing, no cough, no dizziness, no HAs, no rashes, no melena/hematochezia.  No polyuria or polydipsia.  No myalgias or arthralgias.  No focal weakness, paresthesias, or tremors.  No acute vision or hearing abnormalities.  No dysuria or unusual/new urinary urgency or frequency.  No recent changes in lower legs. No n/v/d or abd pain.  No palpitations.    Past Medical History:  Diagnosis Date  .  Adult ADHD    managed by psychiatrist up until 09/2016  . Bipolar affective disorder (HCC)    managed by psych up until 09/2016  . Borderline hyperlipidemia 04/2017  . Diabetes mellitus (HCC) 2018   2018 HbA1c 5.9%.  Random gluc >200 and Hba1c 6.4% 02/2020.  05/2017 Essential hypertension 2018  . Fatty liver 2010   Noted on CT abd  . Gout    two episodes1 yr apart--big toe.  Never been on preventative meds.  . Nonspecific mesenteric adenitis 2010   Hx of  . Obesity, Class II, BMI 35-39.9     History reviewed. No pertinent surgical history.  Outpatient Medications Prior to Visit  Medication Sig Dispense Refill  . allopurinol (ZYLOPRIM) 100 MG tablet 2 tabs po qd 60 tablet 6  . predniSONE (DELTASONE) 10 MG tablet 2 tabs po qd x 7d, then 1 tab po qd x 7d (Patient not taking: Reported on 03/20/2020) 21 tablet 0   No facility-administered medications prior to visit.    No Known Allergies  ROS As per HPI  PE: Vitals with BMI 03/20/2020 02/20/2020 08/12/2018  Height 6' 1.5" 6' 1.5" 6' 1.5"  Weight 312 lbs 3 oz 307 lbs 13 oz 292 lbs  BMI 40.63 40.05 38  Systolic 130 156 03/22/2020  Diastolic 88 85 84  Pulse 76 85 82  Gen: Alert, well appearing.  Patient is oriented to person, place, time, and situation. AFFECT: pleasant, lucid thought and speech. No further exam today.  LABS:    Chemistry      Component Value Date/Time   NA 136 02/20/2020 1521   K 4.0 02/20/2020 1521   CL 101 02/20/2020 1521   CO2 26 02/20/2020 1521   BUN 9 02/20/2020 1521   CREATININE 0.99 02/20/2020 1521      Component Value Date/Time   CALCIUM 9.2 02/20/2020 1521   ALKPHOS 49 04/30/2017 1117   AST 21 04/30/2017 1117   ALT 26 04/30/2017 1117   BILITOT 0.7 04/30/2017 1117     Lab Results  Component Value Date   LABURIC 6.6 03/16/2020   Lab Results  Component Value Date   HGBA1C 6.4 02/22/2020    IMPRESSION AND PLAN:  1) Gout: great response to allopur 200 qd.  Continue this.  2) Newly dx'd DM 2:   Start metformin 500 mg bid, ref to nutritionist, plan recheck Hba1c 3 mo.  3) Bipolar II: not well controlled. Will get him back on lamictal which brought remission in the past. Start 25mg  qd and recheck 1 mo.  An After Visit Summary was printed and given to the patient.  FOLLOW UP: Return in about 4 weeks (around 04/17/2020) for routine chronic illness f/u.  Signed:  06/17/2020, MD           03/20/2020

## 2020-04-15 ENCOUNTER — Other Ambulatory Visit: Payer: Self-pay | Admitting: Family Medicine

## 2020-04-17 ENCOUNTER — Ambulatory Visit: Payer: BC Managed Care – PPO | Admitting: Family Medicine

## 2020-04-17 NOTE — Progress Notes (Deleted)
OFFICE VISIT  04/17/2020  CC: No chief complaint on file.   HPI:    Patient is a 40 y.o. Caucasian male who presents for 1 mo f/u bipolar II d/o. A/P as of last visit: "1) Gout: great response to allopur 200 qd.  Continue this.  2) Newly dx'd DM 2:  Start metformin 500 mg bid, ref to nutritionist, plan recheck Hba1c 3 mo.  3) Bipolar II: not well controlled. Will get him back on lamictal which brought remission in the past. Start 25mg  qd and recheck 1 mo."  INTERIM HX: ***   Past Medical History:  Diagnosis Date  . Adult ADHD    managed by psychiatrist up until 09/2016  . Bipolar affective disorder (HCC)    managed by psych up until 09/2016  . Borderline hyperlipidemia 04/2017  . Diabetes mellitus (HCC) 2018   2018 HbA1c 5.9%.  Random gluc >200 and Hba1c 6.4% 02/2020.  03/2020 Essential hypertension 2018  . Fatty liver 2010   Noted on CT abd  . Gout    two episodes1 yr apart--big toe.  Never been on preventative meds.  . Nonspecific mesenteric adenitis 2010   Hx of  . Obesity, Class II, BMI 35-39.9     No past surgical history on file.  Outpatient Medications Prior to Visit  Medication Sig Dispense Refill  . allopurinol (ZYLOPRIM) 100 MG tablet 2 tabs po qd 60 tablet 6  . lamoTRIgine (LAMICTAL) 25 MG tablet 1 tab po qd 30 tablet 0  . metFORMIN (GLUCOPHAGE) 500 MG tablet Take 1 tablet (500 mg total) by mouth 2 (two) times daily with a meal. 60 tablet 3  . predniSONE (DELTASONE) 10 MG tablet 2 tabs po qd x 7d, then 1 tab po qd x 7d (Patient not taking: Reported on 03/20/2020) 21 tablet 0   No facility-administered medications prior to visit.    No Known Allergies  ROS As per HPI  PE: Vitals with BMI 03/20/2020 02/20/2020 08/12/2018  Height 6' 1.5" 6' 1.5" 6' 1.5"  Weight 312 lbs 3 oz 307 lbs 13 oz 292 lbs  BMI 40.63 40.05 38  Systolic 130 156 10/12/2018  Diastolic 88 85 84  Pulse 76 85 82     ***  LABS:    Chemistry      Component Value Date/Time   NA 136  02/20/2020 1521   K 4.0 02/20/2020 1521   CL 101 02/20/2020 1521   CO2 26 02/20/2020 1521   BUN 9 02/20/2020 1521   CREATININE 0.99 02/20/2020 1521      Component Value Date/Time   CALCIUM 9.2 02/20/2020 1521   ALKPHOS 49 04/30/2017 1117   AST 21 04/30/2017 1117   ALT 26 04/30/2017 1117   BILITOT 0.7 04/30/2017 1117     Lab Results  Component Value Date   HGBA1C 6.4 02/22/2020   Lab Results  Component Value Date   LABURIC 6.6 03/16/2020    IMPRESSION AND PLAN:  No problem-specific Assessment & Plan notes found for this encounter.  No labs due.  An After Visit Summary was printed and given to the patient.  FOLLOW UP: No follow-ups on file.  Signed:  05/16/2020, MD           04/17/2020

## 2020-05-15 ENCOUNTER — Other Ambulatory Visit: Payer: Self-pay | Admitting: Family Medicine

## 2020-05-15 DIAGNOSIS — R110 Nausea: Secondary | ICD-10-CM | POA: Diagnosis not present

## 2020-05-15 DIAGNOSIS — Z6839 Body mass index (BMI) 39.0-39.9, adult: Secondary | ICD-10-CM | POA: Diagnosis not present

## 2020-05-15 DIAGNOSIS — K529 Noninfective gastroenteritis and colitis, unspecified: Secondary | ICD-10-CM | POA: Diagnosis not present

## 2020-06-15 ENCOUNTER — Other Ambulatory Visit: Payer: Self-pay | Admitting: Family Medicine

## 2020-08-23 DIAGNOSIS — R079 Chest pain, unspecified: Secondary | ICD-10-CM | POA: Diagnosis not present

## 2020-08-23 DIAGNOSIS — I10 Essential (primary) hypertension: Secondary | ICD-10-CM | POA: Diagnosis not present

## 2020-09-12 ENCOUNTER — Other Ambulatory Visit: Payer: Self-pay | Admitting: Family Medicine

## 2020-10-16 ENCOUNTER — Telehealth: Payer: Self-pay | Admitting: Family Medicine

## 2020-11-19 MED ORDER — ALLOPURINOL 100 MG PO TABS: ORAL_TABLET | ORAL | 0 refills | Status: DC

## 2020-11-19 NOTE — Telephone Encounter (Signed)
Spoke with pt and advised refill sent for 30 d supply until scheduled appt. He voiced understanding

## 2020-11-19 NOTE — Telephone Encounter (Signed)
Pt called requesting refill on medication & was advised he is past due for follow up. Pt is in between jobs. His new insurance starts on 12/4. He has scheduled appt for 12/5 with Dr. Milinda Cave. He is out of:  allopurinol (ZYLOPRIM) 100 MG tablet  [id number] - [place], [place] - [address]

## 2020-11-19 NOTE — Addendum Note (Signed)
Addended by: Emi Holes D on: 11/19/2020 09:29 AM   Modules accepted: Orders

## 2020-12-10 ENCOUNTER — Ambulatory Visit: Payer: BC Managed Care – PPO | Admitting: Family Medicine

## 2020-12-10 NOTE — Progress Notes (Deleted)
OFFICE VISIT  12/10/2020  CC: No chief complaint on file.   HPI:    Patient is a 40 y.o. male who presents for f/u DM, bipolar d/o, HTN, and gout. A/P as of last visit 9 months ago: "1) Gout: great response to allopur 200 qd.  Continue this.   2) Newly dx'd DM 2:  Start metformin 500 mg bid, ref to nutritionist, plan recheck Hba1c 3 mo.   3) Bipolar II: not well controlled. Will get him back on lamictal which brought remission in the past. Start 25mg  qd and recheck 1 mo."  INTERIM HX: ***  Past Medical History:  Diagnosis Date   Adult ADHD    managed by psychiatrist up until 09/2016   Bipolar affective disorder (HCC)    managed by psych up until 09/2016   Borderline hyperlipidemia 04/2017   Diabetes mellitus (HCC) 2018   2018 HbA1c 5.9%.  Random gluc >200 and Hba1c 6.4% 02/2020.   Essential hypertension 2018   Fatty liver 2010   Noted on CT abd   Gout    two episodes1 yr apart--big toe.  Never been on preventative meds.   Nonspecific mesenteric adenitis 2010   Hx of   Obesity, Class II, BMI 35-39.9     No past surgical history on file.  Outpatient Medications Prior to Visit  Medication Sig Dispense Refill   allopurinol (ZYLOPRIM) 100 MG tablet Take 2 tablets by mouth once daily 60 tablet 0   lamoTRIgine (LAMICTAL) 25 MG tablet Take 1 tablet by mouth once daily 14 tablet 0   metFORMIN (GLUCOPHAGE) 500 MG tablet TAKE 1 TABLET BY MOUTH TWICE DAILY WITH A MEAL 60 tablet 0   predniSONE (DELTASONE) 10 MG tablet 2 tabs po qd x 7d, then 1 tab po qd x 7d (Patient not taking: Reported on 03/20/2020) 21 tablet 0   No facility-administered medications prior to visit.    No Known Allergies  ROS As per HPI  PE: Vitals with BMI 03/20/2020 02/20/2020 08/12/2018  Height 6' 1.5" 6' 1.5" 6' 1.5"  Weight 312 lbs 3 oz 307 lbs 13 oz 292 lbs  BMI 40.63 40.05 38  Systolic 130 156 10/12/2018  Diastolic 88 85 84  Pulse 76 85 82     ***  LABS:  Lab Results  Component Value Date   TSH  3.72 04/30/2017   Lab Results  Component Value Date   WBC 5.0 02/20/2020   HGB 14.3 02/20/2020   HCT 41.7 02/20/2020   MCV 84.4 02/20/2020   PLT 318.0 02/20/2020   Lab Results  Component Value Date   CREATININE 0.99 02/20/2020   BUN 9 02/20/2020   NA 136 02/20/2020   K 4.0 02/20/2020   CL 101 02/20/2020   CO2 26 02/20/2020   Lab Results  Component Value Date   ALT 26 04/30/2017   AST 21 04/30/2017   ALKPHOS 49 04/30/2017   BILITOT 0.7 04/30/2017   Lab Results  Component Value Date   CHOL 198 04/30/2017   Lab Results  Component Value Date   HDL 32.10 (L) 04/30/2017   Lab Results  Component Value Date   LDLCALC 127 (H) 04/30/2017   Lab Results  Component Value Date   TRIG 195.0 (H) 04/30/2017   Lab Results  Component Value Date   CHOLHDL 6 04/30/2017   Lab Results  Component Value Date   HGBA1C 6.4 02/22/2020   IMPRESSION AND PLAN:  No problem-specific Assessment & Plan notes found for this encounter.  Prevnar 20->***. Flu->***  An After Visit Summary was printed and given to the patient.  FOLLOW UP: No follow-ups on file.  Signed:  Santiago Bumpers, MD           12/10/2020

## 2020-12-11 ENCOUNTER — Other Ambulatory Visit: Payer: Self-pay | Admitting: Family Medicine

## 2020-12-11 NOTE — Telephone Encounter (Signed)
Spoke with pt and he thought his appt was tomorrow. Pt also mentioned his new insurance does not start until 1/7, new appt scheduled for 1/11. 30 d supply givenMychart sign up information sent as well.

## 2021-01-06 HISTORY — PX: CARDIOVASCULAR STRESS TEST: SHX262

## 2021-01-10 ENCOUNTER — Other Ambulatory Visit: Payer: Self-pay | Admitting: Family Medicine

## 2021-01-10 NOTE — Telephone Encounter (Signed)
Has upcoming appt °

## 2021-01-15 ENCOUNTER — Other Ambulatory Visit: Payer: Self-pay

## 2021-01-16 ENCOUNTER — Encounter: Payer: Self-pay | Admitting: Family Medicine

## 2021-01-16 ENCOUNTER — Ambulatory Visit: Payer: Self-pay | Admitting: Family Medicine

## 2021-01-16 NOTE — Progress Notes (Deleted)
OFFICE VISIT  01/16/2021  CC: No chief complaint on file.   HPI:    Patient is a 41 y.o. male who presents for f/u borderline DM 2, HTN, hx of adult ADD and bipolar d/o. I last saw him about ten months ago. A/P as of last visit: "1) Gout: great response to allopur 200 qd.  Continue this.   2) Newly dx'd DM 2:  Start metformin 500 mg bid, ref to nutritionist, plan recheck Hba1c 3 mo.   3) Bipolar II: not well controlled. Will get him back on lamictal which brought remission in the past. Start 22m qd and recheck 1 mo."  INTERIM HX: [facility] and hypertensive urgency.  I reviewed this entire encounter today. He had chest pain with some typical and some atypical features, had to be admitted when his blood pressure shot up in the emergency department. Troponins negative.  EKG nonconcerning.  D-dimer negative.  Chest x-ray normal.  CBC and metabolic panel normal.  Cardiolite stress test showed ejection fraction 66%, no inducible ischemia, normal LV function. He was started on losartan 50 mg a day.  He was discharged on his allopurinol 200 mg a day as well.  Labs from 01/07/2021 reviewed: All normal CBC and c-Met--specifically, his sodium was 138, potassium 4.4, and creatinine 0.9. LDL 130 HDL 28 triglycerides 179. Hemoglobin A1c 6.3%.  Currently: ***    Past Medical History:  Diagnosis Date   Adult ADHD    managed by psychiatrist up until 09/2016   [place]    [place] psych up until 09/2016   Borderline hyperlipidemia 04/2017   [place] ([place]   2018 HbA1c 5.9%.  Random gluc >200 and Hba1c 6.4% 02/2020.   Essential hypertension 2018   Fatty liver 2010   Noted on CT abd   Gout    two episodes1 yr apart--big toe.  Never been on preventative meds.   Nonspecific mesenteric adenitis 2010   Hx of   Obesity, Class II, BMI 35-39.9     No past surgical  history on file.  Outpatient Medications Prior to Visit  Medication Sig Dispense Refill   allopurinol (ZYLOPRIM) 100 MG tablet Take 2 tablets by mouth once daily 60 tablet 0   lamoTRIgine (LAMICTAL) 25 MG tablet Take 1 tablet by mouth once daily 14 tablet 0   losartan (COZAAR) 50 MG tablet Take 50 mg by mouth daily.     metFORMIN (GLUCOPHAGE) 500 MG tablet TAKE 1 TABLET BY MOUTH TWICE DAILY WITH A MEAL 60 tablet 0   predniSONE (DELTASONE) 10 MG tablet 2 tabs po qd x 7d, then 1 tab po qd x 7d (Patient not taking: Reported on 03/20/2020) 21 tablet 0   No facility-administered medications prior to visit.    No Known Allergies  ROS As per HPI  PE: Vitals with BMI 03/20/2020 02/20/2020 08/12/2018  Height 6' 1.5" 6' 1.5" 6' 1.5"  Weight 312 lbs 3 oz 307 lbs 13 oz 292 lbs  BMI [phone]  Systolic [id number] Diastolic 88 85 84  Pulse 76 85 82     Physical Exam  ***  LABS:  Last CBC Lab Results  Component Value Date   WBC 5.0 02/20/2020   HGB 14.3 02/20/2020   HCT 41.7 02/20/2020   MCV 84.4 02/20/2020   RDW 12.5 02/20/2020   PLT 318.0 078/58/8502  Last metabolic  panel Lab Results  Component Value Date   GLUCOSE 225 (H) 02/20/2020   NA 136 02/20/2020   K 4.0 02/20/2020   CL 101 02/20/2020   CO2 26 02/20/2020   BUN 9 02/20/2020   CREATININE 0.99 02/20/2020   CALCIUM 9.2 02/20/2020   PROT 7.3 04/30/2017   ALBUMIN 4.6 04/30/2017   BILITOT 0.7 04/30/2017   ALKPHOS 49 04/30/2017   AST 21 04/30/2017   ALT 26 04/30/2017   Last lipids Lab Results  Component Value Date   CHOL 198 04/30/2017   HDL 32.10 (L) 04/30/2017   LDLCALC 127 (H) 04/30/2017   TRIG 195.0 (H) 04/30/2017   CHOLHDL 6 04/30/2017   Last hemoglobin A1c Lab Results  Component Value Date   HGBA1C 6.4 02/22/2020   Last thyroid functions Lab Results  Component Value Date   TSH 3.72 04/30/2017   IMPRESSION AND PLAN:  No problem-specific Assessment & Plan notes found for this  encounter.  Next A1c after 04/07/21  An After Visit Summary was printed and given to the patient.  FOLLOW UP: No follow-ups on file.  Signed:  Crissie Sickles, MD           01/16/2021

## 2021-02-05 ENCOUNTER — Telehealth: Payer: Self-pay

## 2021-02-05 MED ORDER — ALLOPURINOL 100 MG PO TABS: 200.0000 mg | ORAL_TABLET | Freq: Every day | ORAL | 0 refills | Status: DC

## 2021-02-05 NOTE — Telephone Encounter (Signed)
30 d/s given on medication. Pt was advised.

## 2021-02-05 NOTE — Telephone Encounter (Signed)
Patient refill request. Patient scheduled appt with Dr. Milinda Cave on 02/15/21.  [facility] [place]  allopurinol (ZYLOPRIM) 100 MG tablet [[id number]]

## 2021-02-09 ENCOUNTER — Other Ambulatory Visit: Payer: Self-pay | Admitting: Family Medicine

## 2021-02-14 ENCOUNTER — Other Ambulatory Visit: Payer: Self-pay

## 2021-02-15 ENCOUNTER — Encounter: Payer: Self-pay | Admitting: Family Medicine

## 2021-02-15 ENCOUNTER — Ambulatory Visit (INDEPENDENT_AMBULATORY_CARE_PROVIDER_SITE_OTHER): Payer: Self-pay | Admitting: Family Medicine

## 2021-02-15 VITALS — BP 133/81 | HR 79 | Temp 98.0°F | Ht 73.5 in | Wt 310.2 lb

## 2021-02-15 DIAGNOSIS — R7303 Prediabetes: Secondary | ICD-10-CM

## 2021-02-15 DIAGNOSIS — I10 Essential (primary) hypertension: Secondary | ICD-10-CM

## 2021-02-15 DIAGNOSIS — G5603 Carpal tunnel syndrome, bilateral upper limbs: Secondary | ICD-10-CM

## 2021-02-15 DIAGNOSIS — R202 Paresthesia of skin: Secondary | ICD-10-CM

## 2021-02-15 DIAGNOSIS — R200 Anesthesia of skin: Secondary | ICD-10-CM

## 2021-02-15 MED ORDER — ALLOPURINOL 100 MG PO TABS: 200.0000 mg | ORAL_TABLET | Freq: Every day | ORAL | 3 refills | Status: DC

## 2021-02-15 MED ORDER — LOSARTAN POTASSIUM 50 MG PO TABS: 50.0000 mg | ORAL_TABLET | Freq: Every day | ORAL | 3 refills | Status: DC

## 2021-02-15 MED ORDER — TRIAMCINOLONE ACETONIDE 40 MG/ML IJ SUSP
40.0000 mg | Freq: Once | INTRAMUSCULAR | Status: AC
  Administered 2021-02-15: 40 mg via INTRAMUSCULAR

## 2021-02-15 NOTE — Progress Notes (Signed)
OFFICE VISIT  02/15/2021  CC:  Chief Complaint  Patient presents with   Follow-up    RCI, pt is fasting    Patient is a 41 y.o. male who presents for follow-up hypertension, borderline diabetes. He has a history of adult ADD and bipolar disorder. There has been some recent history of noncompliance with medications. Admitted to [facility] 01/05/2021 to 01/08/2021 for chest pain and hypertensive urgency. Cardiolite stress test was negative for ischemia, ejection fraction normal.  He ruled out for MI.  CBC, c-Met, TSH, and lipids all within normal limits.  Hemoglobin A1c was 6.3%.  He was discharged home on losartan.  INTERIM HX:  Erik Davis feels well. Home blood pressures 130/80 or better since hospitalization. Already losartan 50 mg a day well.  Metformin makes him have upset stomach too much so he stopped it. Home glucoses around 100-120 fasting and random.  Mood has been good, off Lamictal.  Has bilateral hand numbness x6 months.  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  Not really any wrist or fingers pain, just abnormal sensation that really bothers him.  Bothers him particularly at night when trying to sleep.  He can get his wrist occasionally in the right position and the symptoms get better but no do not resolve.  He has tried wearing wrist splints but finds these very uncomfortable and has stopped them. No neck pain.  He does not have any tingling or numbness or pain in the shoulders or arms. No distinct weakness to his hands. Takes some Aleve most mornings and this does a little bit to help his discomfort.   Past Medical History:  Diagnosis Date   Adult ADHD    managed by psychiatrist up until 09/2016   Bipolar affective disorder ([place])    managed by psych up until 09/2016   Borderline hyperlipidemia 04/2017   Diabetes mellitus ([place]) 2018   2018 HbA1c 5.9%.  Random gluc >200 and Hba1c 6.4% 02/2020.    Essential hypertension 2018   Fatty liver 2010   Noted on CT abd   Gout    two episodes1 yr apart--big toe.  Never been on preventative meds.   Nonspecific mesenteric adenitis 2010   Hx of   Obesity, Class II, BMI 35-39.9     Past Surgical History:  Procedure Laterality Date   CARDIOVASCULAR STRESS TEST  01/2021   EF 66%, no inducible ischemia.    Outpatient Medications Prior to Visit  Medication Sig Dispense Refill   lamoTRIgine (LAMICTAL) 25 MG tablet Take 1 tablet by mouth once daily 14 tablet 0   metFORMIN (GLUCOPHAGE) 500 MG tablet TAKE 1 TABLET BY MOUTH TWICE DAILY WITH A MEAL 60 tablet 0   losartan (COZAAR) 50 MG tablet Take 50 mg by mouth daily.     allopurinol (ZYLOPRIM) 100 MG tablet Take 2 tablets (200 mg total) by mouth daily. 60 tablet 0   predniSONE (DELTASONE) 10 MG tablet 2 tabs po qd x 7d, then 1 tab po qd x 7d (Patient not taking: Reported on 03/20/2020) 21 tablet 0   No facility-administered medications prior to visit.    No Known Allergies  ROS As per HPI  PE: Vitals with BMI 02/15/2021 03/20/2020 02/20/2020  Height 6' 1.5" 6' 1.5" 6' 1.5"  Weight 310 lbs 3 oz 312 lbs 3 oz 307 lbs 13 oz  BMI 40.37 62.83 15.17  Systolic 616 073 710  Diastolic 81 88 85  Pulse 79 76 85     Physical Exam  General: Alert and well-appearing. Affect is pleasant, lucid thought and speech. Cardiovascular exam shows regular rhythm and rate without murmur rub or gallop.  He has clear lungs bilaterally, nonlabored respirations. Extremities show no edema. Full range of motion of wrist and fingers.  Wrist extension and flexion as well as eversion and inversion are intact without pain.  He has good strength in hands/fingers.  Mild decreased sensation to touch in the second third and fourth digits on each hand and over palmar surface in same distribution. Tinel's test over median nerve at the wrist and over ulnar nerve at the cubital tunnel are negative. Phalen's testing at the  wrist does bring on significant tingling and numbness in index and middle finger bilaterally.   LABS:  Last metabolic panel Lab Results  Component Value Date   GLUCOSE 225 (H) 02/20/2020   NA 136 02/20/2020   K 4.0 02/20/2020   CL 101 02/20/2020   CO2 26 02/20/2020   BUN 9 02/20/2020   CREATININE 0.99 02/20/2020   CALCIUM 9.2 02/20/2020   PROT 7.3 04/30/2017   ALBUMIN 4.6 04/30/2017   BILITOT 0.7 04/30/2017   ALKPHOS 49 04/30/2017   AST 21 04/30/2017   ALT 26 04/30/2017   Lab Results  Component Value Date   HGBA1C 6.4 02/22/2020    IMPRESSION AND PLAN:  #1 hypertension, well hold on losartan 50 mg a day now. Recent electrolytes and renal function normal.  2.  Borderline diabetes.  Intolerant of metformin.  Diet and exercise seem sufficient lately because home blood glucose consistently near normal and most recent hemoglobin A1c 5 weeks ago in the hospital was 6.3%. Plan repeat A1c in a couple months.  #3 history of adult ADHD and bipolar disorder.  He is stable/doing well on no medication for quite a while now.  #4 carpal tunnel syndrome bilaterally.  He does have numbness in the ring finger bilaterally as well, possibly indicating some ulnar entrapment at the wrist as well. He is thinking about switching jobs to give hand/fingers relative rest.  Failed trial of splints. Discussed steroid injection around the median nerve today and he was in favor of trying this. Side ultrasound today: Left median nerve area 0.13 cm Right median nerve area 0.10 cm   Procedure: Therapeutic CTS injection-->Right and Left sides.  The patient's clinical condition is marked by substantial pain and/or significant functional disability.  Other conservative therapy has not provided relief, is contraindicated, or not appropriate.  There is a reasonable likelihood that injection will significantly improve the patient's pain and/or functional disability.  Consent obtained. Cleaned skin with  typical sterile fashion.  Injected 39m kenalog+ 1/2  ml of 1% plain lidocaine into median nerve sheath with ultrasound guidance.  No immediate complications.  Patient tolerated procedure well.  Post-injection care discussed, including 20 min of icing 1-2 times in the next 4-8 hours, frequent non weight-bearing ROM exercises over the next few days, and general pain medication management.  If patient does not improve in the next couple of weeks then we will get him to the orthopedist for consideration of carpal tunnel release.  An After Visit Summary was printed and given to the patient.  FOLLOW UP: Return in about 6 months (around 08/15/2021) for routine chronic illness f/u.  Signed:  PCrissie Sickles MD           02/15/2021

## 2021-02-15 NOTE — Addendum Note (Signed)
Addended by: Emi Holes D on: 02/15/2021 10:31 AM   Modules accepted: Orders

## 2021-03-01 ENCOUNTER — Ambulatory Visit: Payer: Self-pay | Admitting: Family Medicine

## 2021-03-01 NOTE — Progress Notes (Unsigned)
OFFICE VISIT  03/01/2021  CC: No chief complaint on file.   HPI:    Patient is a 41 y.o. male who presents for 2 week f/u bilat CTS, s/p steroid injections. A/P as of last visit: "#1 hypertension, well controlled on losartan 50 mg a day now. Recent electrolytes and renal function normal.  2.  Borderline diabetes.  Intolerant of metformin.  Diet and exercise seem sufficient lately because home blood glucose consistently near normal and most recent hemoglobin A1c 5 weeks ago in the hospital was 6.3%. Plan repeat A1c in a couple months.   #3 history of adult ADHD and bipolar disorder.  He is stable/doing well on no medication for quite a while now.   #4 carpal tunnel syndrome bilaterally.  He does have numbness in the ring finger bilaterally as well, possibly indicating some ulnar entrapment at the wrist as well. He is thinking about switching jobs to give hand/fingers relative rest.  Failed trial of splints. Discussed steroid injection around the median nerve today and he was in favor of trying this. Side ultrasound today: Left median nerve area 0.13 cm Right median nerve area 0.10 cm  INTERIM HX: ***  Past Medical History:  Diagnosis Date   Adult ADHD    managed by psychiatrist up until 09/2016   Bipolar affective disorder ([facility])    managed by psych up until 09/2016   Borderline hyperlipidemia 04/2017   Diabetes mellitus ([facility]) 2018   2018 HbA1c 5.9%.  Random gluc >200 and Hba1c 6.4% 02/2020.   Essential hypertension 2018   Fatty liver 2010   Noted on CT abd   Gout    two episodes1 yr apart--big toe.  Never been on preventative meds.   Nonspecific mesenteric adenitis 2010   Hx of   Obesity, Class II, BMI 35-39.9     Past Surgical History:  Procedure Laterality Date   CARDIOVASCULAR STRESS TEST  01/2021   EF 66%, no inducible ischemia.    Outpatient Medications Prior to Visit  Medication Sig Dispense Refill   allopurinol (ZYLOPRIM) 100 MG tablet Take 2 tablets (200 mg  total) by mouth daily. 90 tablet 3   losartan (COZAAR) 50 MG tablet Take 1 tablet (50 mg total) by mouth daily. 90 tablet 3   No facility-administered medications prior to visit.    Allergies  Allergen Reactions   Metformin And Related Other (See Comments)    GI intolerance     ROS As per HPI  PE: Vitals with BMI 02/15/2021 03/20/2020 02/20/2020  Height 6' 1.5" 6' 1.5" 6' 1.5"  Weight 310 lbs 3 oz 312 lbs 3 oz 307 lbs 13 oz  BMI 40.37 40.63 40.05  Systolic 133 130 801  Diastolic 81 88 85  Pulse 79 76 85     Physical Exam  ***  LABS:  none  IMPRESSION AND PLAN:  No problem-specific Assessment & Plan notes found for this encounter.   An After Visit Summary was printed and given to the patient.  FOLLOW UP: No follow-ups on file.  Signed:  Santiago Bumpers, MD           03/01/2021

## 2021-03-11 ENCOUNTER — Other Ambulatory Visit: Payer: Self-pay | Admitting: Family Medicine

## 2021-03-20 ENCOUNTER — Encounter: Payer: Self-pay | Admitting: Family Medicine

## 2021-03-20 ENCOUNTER — Other Ambulatory Visit: Payer: Self-pay

## 2021-03-20 ENCOUNTER — Ambulatory Visit (INDEPENDENT_AMBULATORY_CARE_PROVIDER_SITE_OTHER): Payer: 59 | Admitting: Family Medicine

## 2021-03-20 VITALS — BP 137/80 | HR 92 | Temp 97.8°F | Ht 73.5 in | Wt 308.0 lb

## 2021-03-20 DIAGNOSIS — G5603 Carpal tunnel syndrome, bilateral upper limbs: Secondary | ICD-10-CM | POA: Diagnosis not present

## 2021-03-20 DIAGNOSIS — I10 Essential (primary) hypertension: Secondary | ICD-10-CM | POA: Diagnosis not present

## 2021-03-20 MED ORDER — LOSARTAN POTASSIUM 100 MG PO TABS: 100.0000 mg | ORAL_TABLET | Freq: Every day | ORAL | 6 refills | Status: DC

## 2021-03-20 NOTE — Progress Notes (Signed)
OFFICE VISIT ? ?03/20/2021 ? ?CC:  ?Chief Complaint  ?Patient presents with  ? Hand Numbness  ? ?Patient is a 41 y.o. male who presents for 1 month follow-up bilateral hand numbness, suspected CTS bilaterally. ?A/P as of last visit: ?"#1 hypertension, well controlled on losartan 50 mg a day now. ?Recent electrolytes and renal function normal. ? ?2.  Borderline diabetes.  Intolerant of metformin.  Diet and exercise seem sufficient lately because home blood glucose consistently near normal and most recent hemoglobin A1c 5 weeks ago in the hospital was 6.3%. ?Plan repeat A1c in a couple months. ?  ?#3 history of adult ADHD and bipolar disorder.  He is stable/doing well on no medication for quite a while now. ?  ?#4 carpal tunnel syndrome bilaterally.  He does have numbness in the ring finger bilaterally as well, possibly indicating some ulnar entrapment at the wrist as well. ?He is thinking about switching jobs to give hand/fingers relative rest.  Failed trial of splints. ?Discussed steroid injection around the median nerve today and he was in favor of trying this---injected both sides today. ?(Bedside ultrasound today: Left median nerve area 0.13 cm? ?Right median nerve area 0.10 cm?)" ? ?INTERIM HX: ?Hands feel great!  It only took a day for symptoms in his hands to completely resolve. ?He also switched jobs to 1 that does not require lots of finger and hand movements. ? ?Home blood pressure measurements consistently 130s over 80s, occasional excursions up to 150 over 90s.  Taking losartan 50 mg daily.  No side effects. ? ?Past Medical History:  ?Diagnosis Date  ? Adult ADHD   ? managed by psychiatrist up until 09/2016  ? Bipolar affective disorder ([facility])   ? managed by psych up until 09/2016  ? Borderline hyperlipidemia 04/2017  ? Diabetes mellitus ([facility]) 2018  ? 2018 HbA1c 5.9%.  Random gluc >200 and Hba1c 6.4% 02/2020.  ? Essential hypertension 2018  ? Fatty liver 2010  ? Noted on CT abd  ? Gout   ? two episodes1 yr  apart--big toe.  Never been on preventative meds.  ? Nonspecific mesenteric adenitis 2010  ? Hx of  ? Obesity, Class II, BMI 35-39.9   ? ? ?Past Surgical History:  ?Procedure Laterality Date  ? CARDIOVASCULAR STRESS TEST  01/2021  ? EF 66%, no inducible ischemia.  ? ? ?Outpatient Medications Prior to Visit  ?Medication Sig Dispense Refill  ? allopurinol (ZYLOPRIM) 100 MG tablet Take 2 tablets (200 mg total) by mouth daily. 90 tablet 3  ? losartan (COZAAR) 50 MG tablet Take 1 tablet (50 mg total) by mouth daily. 90 tablet 3  ? ?No facility-administered medications prior to visit.  ? ? ?Allergies  ?Allergen Reactions  ? Metformin And Related Other (See Comments)  ?  GI intolerance   ? ? ?ROS ?As per HPI ? ?PE: ?Vitals with BMI 03/20/2021 02/15/2021 03/20/2020  ?Height 6' 1.5" 6' 1.5" 6' 1.5"  ?Weight 308 lbs 310 lbs 3 oz 312 lbs 3 oz  ?BMI 40.08 40.37 40.63  ?Systolic 137 133 130  ?Diastolic 80 81 88  ?Pulse 92 79 76  ? ? ? ?Physical Exam ? ?General: Alert and well-appearing. ?Affect: Pleasant, lucid thought and speech. ?No further exam today. ? ?LABS:  ?Last CBC ?Lab Results  ?Component Value Date  ? WBC 5.0 02/20/2020  ? HGB 14.3 02/20/2020  ? HCT 41.7 02/20/2020  ? MCV 84.4 02/20/2020  ? RDW 12.5 02/20/2020  ? PLT 318.0 02/20/2020  ? ?  Last metabolic panel ?Lab Results  ?Component Value Date  ? GLUCOSE 225 (H) 02/20/2020  ? NA 136 02/20/2020  ? K 4.0 02/20/2020  ? CL 101 02/20/2020  ? CO2 26 02/20/2020  ? BUN 9 02/20/2020  ? CREATININE 0.99 02/20/2020  ? CALCIUM 9.2 02/20/2020  ? PROT 7.3 04/30/2017  ? ALBUMIN 4.6 04/30/2017  ? BILITOT 0.7 04/30/2017  ? ALKPHOS 49 04/30/2017  ? AST 21 04/30/2017  ? ALT 26 04/30/2017  ? ?Last lipids ?Lab Results  ?Component Value Date  ? CHOL 198 04/30/2017  ? HDL 32.10 (L) 04/30/2017  ? LDLCALC 127 (H) 04/30/2017  ? TRIG 195.0 (H) 04/30/2017  ? CHOLHDL 6 04/30/2017  ? ?Last hemoglobin A1c ?Lab Results  ?Component Value Date  ? HGBA1C 6.4 02/22/2020  ? ?Last thyroid functions ?Lab  Results  ?Component Value Date  ? TSH 3.72 04/30/2017  ? ?IMPRESSION AND PLAN: ? ?#1 bilateral carpal tunnel syndrome: Resolved. ? ?#2 hypertension, not well controlled. ?Increase losartan to 100 mg a day. ?Continue home blood pressure monitoring. ? ?#3 type 2 diabetes. ?A1c 6.3% while in hosp 01/2021.  Intolerant of metformin. ?Has been doing well with diet.  Weight is stable. ?Rpt A1c and be met 1 mo. ? ?An After Visit Summary was printed and given to the patient. ? ?FOLLOW UP: Return in about 4 weeks (around 04/17/2021) for f/u HTN (fasting not necessary). ? ?Signed:  Crissie Sickles, MD           03/20/2021 ? ?

## 2021-03-28 ENCOUNTER — Telehealth: Payer: Self-pay

## 2021-03-28 NOTE — Telephone Encounter (Addendum)
Last rx sent 02/15/21(90,3) to [facility]. Confirmed rx received. [employer] has to confirm pt does not have 2 accounts. Confirmed pt does not have 2 accounts but Mardella Layman was calling to request RF for Sildenafil. Pt has not had rx for this. Pharmacy tech will notify rx denied and advise pt to schedule appt ?

## 2021-03-28 NOTE — Telephone Encounter (Signed)
Mardella Layman from [place] calling in refill request. ?[employer] [phone] ? ?allopurinol (ZYLOPRIM) 100 MG tablet [[id number]]  ? ? ?

## 2021-04-17 ENCOUNTER — Encounter: Payer: Self-pay | Admitting: Family Medicine

## 2021-04-17 ENCOUNTER — Telehealth: Payer: Self-pay | Admitting: Family Medicine

## 2021-04-17 NOTE — Telephone Encounter (Signed)
Pt is scheduled for appt 04/18/21 with Dr. Milinda Cave ? ?History of no shows: ? ?04/18/2016 lost/would be late and had to reschedule ?04/29/2017 no show ?08/03/2017 same day cancellation ?01/29/2018 no show ?03/12/2018 no show ?12/10/2020 no show ?01/16/2021 no show ?03/01/2021 no show ? ?Please notify me if you want to proceed with dismissal. ?

## 2021-04-17 NOTE — Telephone Encounter (Signed)
Sending final warning letter ?

## 2021-04-17 NOTE — Telephone Encounter (Signed)
No dismissal yet but please send final warning letter.  Thanks. ?

## 2021-04-17 NOTE — Telephone Encounter (Signed)
Please see message below

## 2021-04-18 ENCOUNTER — Ambulatory Visit: Payer: 59 | Admitting: Family Medicine

## 2021-04-18 NOTE — Progress Notes (Deleted)
OFFICE VISIT ? ?04/18/2021 ? ?CC: No chief complaint on file. ? ? ?HPI:   ? ?Patient is a 41 y.o. male who presents for 1 month follow-up of uncontrolled hypertension as well as follow-up diabetes. ?A/P as of last visit: ?"#1 bilateral carpal tunnel syndrome: Resolved. ?  ?#2 hypertension, not well controlled. ?Increase losartan to 100 mg a day. ?Continue home blood pressure monitoring. ?  ?#3 type 2 diabetes. ?A1c 6.3% while in hosp 01/2021.  Intolerant of metformin. ?Has been doing well with diet.  Weight is stable. ?Rpt A1c and be met 1 mo." ? ?INTERIM HX: ?*** ? ?Past Medical History:  ?Diagnosis Date  ? Adult ADHD   ? managed by psychiatrist up until 09/2016  ? Bipolar affective disorder ([place])   ? managed by psych up until 09/2016  ? Borderline hyperlipidemia 04/2017  ? Diabetes mellitus ([place]) 2018  ? 2018 HbA1c 5.9%.  Random gluc >200 and Hba1c 6.4% 02/2020.  ? Essential hypertension 2018  ? Fatty liver 2010  ? Noted on CT abd  ? Gout   ? two episodes1 yr apart--big toe.  Never been on preventative meds.  ? Nonspecific mesenteric adenitis 2010  ? Hx of  ? Obesity, Class II, BMI 35-39.9   ? ? ?Past Surgical History:  ?Procedure Laterality Date  ? CARDIOVASCULAR STRESS TEST  01/2021  ? EF 66%, no inducible ischemia.  ? ? ?Outpatient Medications Prior to Visit  ?Medication Sig Dispense Refill  ? allopurinol (ZYLOPRIM) 100 MG tablet Take 2 tablets (200 mg total) by mouth daily. 90 tablet 3  ? losartan (COZAAR) 100 MG tablet Take 1 tablet (100 mg total) by mouth daily. 30 tablet 6  ? ?No facility-administered medications prior to visit.  ? ? ?Allergies  ?Allergen Reactions  ? Metformin And Related Other (See Comments)  ?  GI intolerance   ? ? ?ROS ?As per HPI ? ?PE: ? ?  03/20/2021  ?  4:05 PM 02/15/2021  ?  9:16 AM 03/20/2020  ?  9:19 AM  ?Vitals with BMI  ?Height 6' 1.5" 6' 1.5" 6' 1.5"  ?Weight 308 lbs 310 lbs 3 oz 312 lbs 3 oz  ?BMI 40.08 40.37 40.63  ?Systolic 354 656 812  ?Diastolic 80 81 88  ?Pulse 92 79 76   ? ? ? ?Physical Exam ? ?*** ? ?LABS:  ?Last CBC ?Lab Results  ?Component Value Date  ? WBC 5.0 02/20/2020  ? HGB 14.3 02/20/2020  ? HCT 41.7 02/20/2020  ? MCV 84.4 02/20/2020  ? RDW 12.5 02/20/2020  ? PLT 318.0 02/20/2020  ? ?Last metabolic panel ?Lab Results  ?Component Value Date  ? GLUCOSE 225 (H) 02/20/2020  ? NA 136 02/20/2020  ? K 4.0 02/20/2020  ? CL 101 02/20/2020  ? CO2 26 02/20/2020  ? BUN 9 02/20/2020  ? CREATININE 0.99 02/20/2020  ? CALCIUM 9.2 02/20/2020  ? PROT 7.3 04/30/2017  ? ALBUMIN 4.6 04/30/2017  ? BILITOT 0.7 04/30/2017  ? ALKPHOS 49 04/30/2017  ? AST 21 04/30/2017  ? ALT 26 04/30/2017  ? ?Last lipids ?Lab Results  ?Component Value Date  ? CHOL 198 04/30/2017  ? HDL 32.10 (L) 04/30/2017  ? LDLCALC 127 (H) 04/30/2017  ? TRIG 195.0 (H) 04/30/2017  ? CHOLHDL 6 04/30/2017  ? ?Last hemoglobin A1c ?Lab Results  ?Component Value Date  ? HGBA1C 6.4 02/22/2020  ? ?Last thyroid functions ?Lab Results  ?Component Value Date  ? TSH 3.72 04/30/2017  ? ?IMPRESSION AND PLAN: ? ?  No problem-specific Assessment & Plan notes found for this encounter. ? ? ?An After Visit Summary was printed and given to the patient. ? ?FOLLOW UP: No follow-ups on file. ? ?Signed:  Crissie Sickles, MD           04/18/2021 ? ?

## 2021-04-30 NOTE — Telephone Encounter (Signed)
Pt was no show again 04/18/21. Please advise ?

## 2021-04-30 NOTE — Telephone Encounter (Signed)
Please advise if dismissal letter or NS letter ?

## 2021-06-11 ENCOUNTER — Other Ambulatory Visit: Payer: Self-pay | Admitting: Family Medicine

## 2021-06-20 ENCOUNTER — Encounter: Payer: Self-pay | Admitting: Family Medicine

## 2021-06-20 ENCOUNTER — Ambulatory Visit (INDEPENDENT_AMBULATORY_CARE_PROVIDER_SITE_OTHER): Payer: 59 | Admitting: Family Medicine

## 2021-06-20 VITALS — BP 127/77 | HR 81 | Temp 98.3°F | Ht 73.5 in | Wt 320.6 lb

## 2021-06-20 DIAGNOSIS — G5603 Carpal tunnel syndrome, bilateral upper limbs: Secondary | ICD-10-CM | POA: Diagnosis not present

## 2021-06-20 MED ORDER — TRIAMCINOLONE ACETONIDE 40 MG/ML IJ SUSP
40.0000 mg | Freq: Once | INTRAMUSCULAR | Status: AC
  Administered 2021-06-20: 40 mg via INTRAMUSCULAR

## 2021-06-20 NOTE — Progress Notes (Addendum)
OFFICE VISIT  06/21/2021  CC:  Chief Complaint  Patient presents with   Hand Pain    Bilateral; 2 weeks. Has used aleve in the mornings   Patient is a 41 y.o. male who presents for hand discomfort.  HPI: Has history of carpal tunnel syndrome in both wrists. Onset 2 weeks ago of uncomfortable sensation in both wrist and hands, some tingling and numbness.  Lots of trouble with sleep lately due to this, cannot find any kind of comfortable position for hands/wrists.  No pain or weakness. Bilat CT inj's 02/15/21 by me.  These helped clear his symptoms 100% until a couple of weeks ago.  He does note that after the injections he began playing video games and playing guitar excessively because his hands felt so good.  He drives a forklift for living.  He does not habitually rest his elbows on anything. He is unable to wear wrist splints, says they are too uncomfortable.   Past Medical History:  Diagnosis Date   Adult ADHD    managed by psychiatrist up until 09/2016   Bipolar affective disorder ([facility])    managed by psych up until 09/2016   Borderline hyperlipidemia 04/2017   Diabetes mellitus ([facility]) 2018   2018 HbA1c 5.9%.  Random gluc >200 and Hba1c 6.4% 02/2020.   Essential hypertension 2018   Fatty liver 2010   Noted on CT abd   Gout    two episodes1 yr apart--big toe.  Never been on preventative meds.   Nonspecific mesenteric adenitis 2010   Hx of   Obesity, Class II, BMI 35-39.9     Past Surgical History:  Procedure Laterality Date   CARDIOVASCULAR STRESS TEST  01/2021   EF 66%, no inducible ischemia.    Outpatient Medications Prior to Visit  Medication Sig Dispense Refill   allopurinol (ZYLOPRIM) 100 MG tablet Take 2 tablets (200 mg total) by mouth daily. 90 tablet 3   losartan (COZAAR) 100 MG tablet Take 1 tablet (100 mg total) by mouth daily. 30 tablet 6   No facility-administered medications prior to visit.    Allergies  Allergen Reactions   Metformin And Related Other  (See Comments)    GI intolerance     ROS As per HPI  PE:    06/20/2021    3:27 PM 03/20/2021    4:05 PM 02/15/2021    9:16 AM  Vitals with BMI  Height 6' 1.5" 6' 1.5" 6' 1.5"  Weight 320 lbs 10 oz 308 lbs 310 lbs 3 oz  BMI 41.72 40.08 40.37  Systolic 127 137 277  Diastolic 77 80 81  Pulse 81 92 79    Physical Exam  General: Alert and well-appearing. Tinel's negative at each elbow. Phalen's positive bilaterally at the wrist. Tinel's negative at each wrist. No thenar wasting. Normal sensation.  Normal strength of wrist and hands. Radial and ulnar pulses 2+ bilaterally. No swelling, erythema, or tenderness of the wrists or hands.  LABS:  Last metabolic panel Lab Results  Component Value Date   GLUCOSE 225 (H) 02/20/2020   NA 136 02/20/2020   K 4.0 02/20/2020   CL 101 02/20/2020   CO2 26 02/20/2020   BUN 9 02/20/2020   CREATININE 0.99 02/20/2020   CALCIUM 9.2 02/20/2020   PROT 7.3 04/30/2017   ALBUMIN 4.6 04/30/2017   BILITOT 0.7 04/30/2017   ALKPHOS 49 04/30/2017   AST 21 04/30/2017   ALT 26 04/30/2017   Lab Results  Component Value Date  HGBA1C 6.4 02/22/2020   IMPRESSION AND PLAN:  Carpal tunnel syndrome bilaterally. Unable to tolerate wrist splints.  Regular use of NSAIDs lately are ineffective. Symptoms impairing sleep and affecting general quality of life. Excellent response to injections 4 months ago. Patient wants to proceed with steroid injection at median nerve bilaterally.  Ultrasound-guided injection is preferred based on studies that show increased duration, increased effect, greater accuracy, decreased procedural pain, increased response rate, and decreased cost with ultrasound-guided versus blind injection. Procedure: Real-time ultrasound guided injection of L and R carpal tunnel/median nerve. Device: GE [employer] informed consent obtained.  Timeout conducted.  No overlying erythema, induration, or other signs of local infection. After  sterile prep with Betadine, injected mixture of 20 mg triamcinolone and 1 cc of 1% plain lidocaine using 25-gauge 1-1/2 inch needle.  injectate seen filling around median nerve. Patient tolerated the procedure well.  No immediate complications.  Post-injection care discussed. Advised to call if fever/chills, erythema, drainage, or persistent bleeding. Images permanently stored and available for review in ultrasound unit. Impression: Technically successful ultrasound-guided injection.  An After Visit Summary was printed and given to the patient.  FOLLOW UP: Return if symptoms worsen or fail to improve.  Signed:  Santiago Bumpers, MD           06/21/2021

## 2021-07-17 ENCOUNTER — Other Ambulatory Visit: Payer: Self-pay | Admitting: Family Medicine

## 2021-07-18 NOTE — Telephone Encounter (Signed)
RF request for allopurinol LOV: 06/20/21 Next ov: n/a Last written: 02/15/21(90,3) [facility]

## 2021-09-07 ENCOUNTER — Other Ambulatory Visit: Payer: Self-pay | Admitting: Family Medicine

## 2021-10-03 ENCOUNTER — Other Ambulatory Visit: Payer: Self-pay | Admitting: Family Medicine

## 2021-11-21 ENCOUNTER — Other Ambulatory Visit: Payer: Self-pay | Admitting: Family Medicine

## 2021-11-27 ENCOUNTER — Telehealth: Payer: Self-pay

## 2021-11-27 NOTE — Telephone Encounter (Signed)
Transition Care Management Follow-up Telephone Call Date of discharge and from where: 11/26/21 [facility] Emergency Dept  How have you been since you were released from the hospital? N/a Any questions or concerns? Yes Pt wanted to know if he should take the metformin that he had from previous usage with new Rx that was given at ED. Advised pt to take Rxs as instructed from ED until seen by PCP  Items Reviewed: Did the pt receive and understand the discharge instructions provided? Yes  Medications obtained and verified? Yes  Other? Yes  Any new allergies since your discharge? No  Dietary orders reviewed? No Do you have support at home? Yes   Home Care and Equipment/Supplies: Were home health services ordered? not applicable If so, what is the name of the agency? N/a  Has the agency set up a time to come to the patient's home? not applicable Were any new equipment or medical supplies ordered?  No What is the name of the medical supply agency? N/a Were you able to get the supplies/equipment? not applicable Do you have any questions related to the use of the equipment or supplies? No  Functional Questionnaire: (I = Independent and D = Dependent) ADLs: I  Bathing/Dressing- I  Meal Prep- I  Eating- I  Maintaining continence- I  Transferring/Ambulation- I  Managing Meds- I  Follow up appointments reviewed:  PCP Hospital f/u appt confirmed? Yes  Scheduled to see McGowen on 12/05/21 @ 11:20. Specialist Hospital f/u appt confirmed? No   Are transportation arrangements needed? No  If their condition worsens, is the pt aware to call PCP or go to the Emergency Dept.? No Was the patient provided with contact information for the PCP's office or ED? No Was to pt encouraged to call back with questions or concerns? Yes

## 2021-11-27 NOTE — Telephone Encounter (Signed)
[facility] Day - Client TELEPHONE ADVICE RECORD Access[employer]urse Patient Name: Erik Davis [place] Gender: Male DOB: Sep 06, 1980 Age: 41 Y 8 M 17 D Return Phone Number: [phone] (Primary) Address: City/ State/ Zip: [place] [address] Client [facility] Day - Client Client Site [facility] - Day Provider Erik Davis - MD Contact Type Call Who Is Calling Patient / Member / Family / Caregiver Call Type Triage / Clinical Relationship To Patient Self Return Phone Number [phone] (Primary) Chief Complaint Blood Sugar High Reason for Call Symptomatic / Request for Health Information Initial Comment Caller states his blood sugar has been 410 for the last past 3 days. Caller is having sx of dizziness. Translation No Nurse Assessment Nurse: [place], RN, Marchelle Folks Date/Time (Eastern Time): 11/26/2021 1:46:03 PM Confirm and document reason for call. If symptomatic, describe symptoms. ---caller states his blood sugar was 410 yesterday. staying over 300s. he is dizzy. Does the patient have any new or worsening symptoms? ---Yes Will a triage be completed? ---Yes Related visit to physician within the last 2 weeks? ---No Does the PT have any chronic conditions? (i.e. diabetes, asthma, this includes High risk factors for pregnancy, etc.) ---Yes List chronic conditions. ---pre diabetic on metformin, HTN, Is this a behavioral health or substance abuse call? ---No Guidelines Guideline Title Affirmed Question Affirmed Notes Nurse Date/Time (Eastern Time) Diabetes - High Blood Sugar [1] Blood glucose > 300 mg/dL (62.8 mmol/L) AND [3] two or more times in a row Humfleet, RN, Marchelle Folks 11/26/2021 1:47:38 PM Disp. Time Lamount Cohen Time) Disposition Final User 11/26/2021 1:50:20 PM Call PCP Now Yes Humfleet, RN, Marchelle Folks Final Disposition 11/26/2021 1:50:20 PM Call PCP Now Yes Humfleet, RN, Marchelle Folks PLEASE NOTE: All timestamps  contained within this report are represented as [place] Standard Time. CONFIDENTIALTY NOTICE: This fax transmission is intended only for the addressee. It contains information that is legally privileged, confidential or otherwise protected from use or disclosure. If you are not the intended recipient, you are strictly prohibited from reviewing, disclosing, copying using or disseminating any of this information or taking any action in reliance on or regarding this information. If you have received this fax in error, please notify us immediately by telephone so that we can arrange for its return to [place]. Phone: [phone], Toll-Free: [phone], Fax: [phone] Page: 2 of 2 Call Id: [id number] Caller Disagree/Comply Comply Caller Understands Yes PreDisposition Did not know what to do Care Advice Given Per Guideline CARE ADVICE given per Diabetes - High Blood Sugar (Adult) guideline. CALL PCP NOW: * I'll page the on-call provider now. If you haven't heard from the provider (or me) within 30 minutes, call again. * You become worse CALL BACK IF: * Vomiting occurs * Rapid breathing occurs Comments User: Jaclynn Major, RN Date/Time Lamount Cohen Time): 11/26/2021 1:49:35 PM has been very thirsty in the last week, up voided 3-4x per night. User: Jaclynn Major, RN Date/Time Lamount Cohen Time): 11/26/2021 1:53:32 PM called back line 3x no answer, sending patient to the ER Referrals Warm transfer to backline

## 2021-12-05 ENCOUNTER — Inpatient Hospital Stay: Payer: 59 | Admitting: Family Medicine

## 2021-12-05 NOTE — Progress Notes (Deleted)
OFFICE VISIT  12/05/2021  CC: No chief complaint on file. HPI: Patient is a 41 y.o. male who presents for f/u HTN, diabetes. He presented to [facility] emergency department on 11/26/2021. He had been having lethargy/fatigue and noted his glucoses in the 300-500 range. I reviewed the ED records today: CBC normal.  Metabolic panel all normal except glucose level 290 and AST 59.  His urinalysis showed marked glucosuria. He was given normal saline and started on Glucotrol (hx of metformin intol).  UPDATE: ***    Past Medical History:  Diagnosis Date   Adult ADHD    managed by psychiatrist up until 09/2016   Bipolar affective disorder (HCC)    managed by psych up until 09/2016   Borderline hyperlipidemia 04/2017   Diabetes mellitus (HCC) 2018   2018 HbA1c 5.9%.  Random gluc >200 and Hba1c 6.4% 02/2020.   Essential hypertension 2018   Fatty liver 2010   Noted on CT abd   Gout    two episodes1 yr apart--big toe.  Never been on preventative meds.   Nonspecific mesenteric adenitis 2010   Hx of   Obesity, Class II, BMI 35-39.9     Past Surgical History:  Procedure Laterality Date   CARDIOVASCULAR STRESS TEST  01/2021   EF 66%, no inducible ischemia.    Outpatient Medications Prior to Visit  Medication Sig Dispense Refill   allopurinol (ZYLOPRIM) 100 MG tablet Take 2 tablets by mouth daily. Please schedule an appointment for further refills. 180 tablet 0   glipiZIDE (GLUCOTROL) 5 MG tablet Take by mouth.     losartan (COZAAR) 100 MG tablet Take 1 tablet (100 mg total) by mouth daily. 30 tablet 6   No facility-administered medications prior to visit.    Allergies  Allergen Reactions   Metformin And Related Other (See Comments)    GI intolerance     ROS As per HPI  PE:    06/20/2021    3:27 PM 03/20/2021    4:05 PM 02/15/2021    9:16 AM  Vitals with BMI  Height 6' 1.5" 6' 1.5" 6' 1.5"  Weight 320 lbs 10 oz 308 lbs 310 lbs 3 oz  BMI 41.72 40.08 40.37  Systolic  127 137 133  Diastolic 77 80 81  Pulse 81 92 79     Physical Exam  ***  LABS:  Last CBC Lab Results  Component Value Date   WBC 5.0 02/20/2020   HGB 14.3 02/20/2020   HCT 41.7 02/20/2020   MCV 84.4 02/20/2020   RDW 12.5 02/20/2020   PLT 318.0 02/20/2020   Last metabolic panel Lab Results  Component Value Date   GLUCOSE 225 (H) 02/20/2020   NA 136 02/20/2020   K 4.0 02/20/2020   CL 101 02/20/2020   CO2 26 02/20/2020   BUN 9 02/20/2020   CREATININE 0.99 02/20/2020   CALCIUM 9.2 02/20/2020   PROT 7.3 04/30/2017   ALBUMIN 4.6 04/30/2017   BILITOT 0.7 04/30/2017   ALKPHOS 49 04/30/2017   AST 21 04/30/2017   ALT 26 04/30/2017   Last lipids Lab Results  Component Value Date   CHOL 198 04/30/2017   HDL 32.10 (L) 04/30/2017   LDLCALC 127 (H) 04/30/2017   TRIG 195.0 (H) 04/30/2017   CHOLHDL 6 04/30/2017   Last hemoglobin A1c Lab Results  Component Value Date   HGBA1C 6.4 02/22/2020   Last thyroid functions Lab Results  Component Value Date   TSH 3.72 04/30/2017   IMPRESSION AND  PLAN:  No problem-specific Assessment & Plan notes found for this encounter.   An After Visit Summary was printed and given to the patient.  FOLLOW UP: No follow-ups on file.  Signed:  Santiago Bumpers, MD           12/05/2021

## 2021-12-06 ENCOUNTER — Other Ambulatory Visit: Payer: Self-pay | Admitting: Family Medicine

## 2021-12-12 ENCOUNTER — Inpatient Hospital Stay: Payer: 59 | Admitting: Family Medicine

## 2021-12-12 NOTE — Progress Notes (Deleted)
OFFICE VISIT  12/12/2021  CC: No chief complaint on file.   Patient is a 41 y.o. male who presents for follow-up emergency department visit for hyperglycemia.  HPI: Patient does have a known diagnosis of diabetes dating back to 2018 but has not required medications.  There was a brief trial of metformin last year but he did not tolerate this medication. Recently he was feeling general malaise and started checking his glucoses at home and those were in the 200s to 300s.  So he presented to the emergency department on 11/26/2021 (16 days ago) where he was found to be significantly hyperglycemic.  Metabolic panel was otherwise normal and CBC normal.  Urine showed glucose but otherwise normal.  He was given IV fluids and started on glipizide in the emergency department.   Past Medical History:  Diagnosis Date   Adult ADHD    managed by psychiatrist up until 09/2016   Bipolar affective disorder (HCC)    managed by psych up until 09/2016   Borderline hyperlipidemia 04/2017   Diabetes mellitus (HCC) 2018   2018 HbA1c 5.9%.  Random gluc >200 and Hba1c 6.4% 02/2020.   Essential hypertension 2018   Fatty liver 2010   Noted on CT abd   Gout    two episodes1 yr apart--big toe.  Never been on preventative meds.   Nonspecific mesenteric adenitis 2010   Hx of   Obesity, Class II, BMI 35-39.9     Past Surgical History:  Procedure Laterality Date   CARDIOVASCULAR STRESS TEST  01/2021   EF 66%, no inducible ischemia.    Outpatient Medications Prior to Visit  Medication Sig Dispense Refill   allopurinol (ZYLOPRIM) 100 MG tablet Take 2 tablets by mouth daily. Please schedule an appointment for further refills. 180 tablet 0   glipiZIDE (GLUCOTROL) 5 MG tablet Take by mouth.     losartan (COZAAR) 100 MG tablet Take 1 tablet (100 mg total) by mouth daily. 30 tablet 6   No facility-administered medications prior to visit.    Allergies  Allergen Reactions   Metformin And Related Other (See  Comments)    GI intolerance     ROS As per HPI  PE:    06/20/2021    3:27 PM 03/20/2021    4:05 PM 02/15/2021    9:16 AM  Vitals with BMI  Height 6' 1.5" 6' 1.5" 6' 1.5"  Weight 320 lbs 10 oz 308 lbs 310 lbs 3 oz  BMI 41.72 40.08 40.37  Systolic 127 137 176  Diastolic 77 80 81  Pulse 81 92 79     Physical Exam  ***  LABS:  Last CBC Lab Results  Component Value Date   WBC 5.0 02/20/2020   HGB 14.3 02/20/2020   HCT 41.7 02/20/2020   MCV 84.4 02/20/2020   RDW 12.5 02/20/2020   PLT 318.0 02/20/2020   Last metabolic panel Lab Results  Component Value Date   GLUCOSE 225 (H) 02/20/2020   NA 136 02/20/2020   K 4.0 02/20/2020   CL 101 02/20/2020   CO2 26 02/20/2020   BUN 9 02/20/2020   CREATININE 0.99 02/20/2020   CALCIUM 9.2 02/20/2020   PROT 7.3 04/30/2017   ALBUMIN 4.6 04/30/2017   BILITOT 0.7 04/30/2017   ALKPHOS 49 04/30/2017   AST 21 04/30/2017   ALT 26 04/30/2017   Last hemoglobin A1c Lab Results  Component Value Date   HGBA1C 6.4 02/22/2020   IMPRESSION AND PLAN:  No problem-specific Assessment & Plan  notes found for this encounter.   An After Visit Summary was printed and given to the patient.  FOLLOW UP: No follow-ups on file.  Signed:  Santiago Bumpers, MD           12/12/2021

## 2022-01-01 ENCOUNTER — Encounter: Payer: Self-pay | Admitting: Family Medicine

## 2022-01-20 ENCOUNTER — Ambulatory Visit: Payer: 59 | Admitting: Family Medicine

## 2022-01-25 ENCOUNTER — Other Ambulatory Visit: Payer: Self-pay | Admitting: Family Medicine

## 2022-04-13 ENCOUNTER — Other Ambulatory Visit: Payer: Self-pay | Admitting: Family Medicine

## 2022-04-26 ENCOUNTER — Other Ambulatory Visit: Payer: Self-pay | Admitting: Family Medicine

## 2022-05-02 ENCOUNTER — Encounter: Payer: Self-pay | Admitting: Family Medicine

## 2022-05-02 ENCOUNTER — Ambulatory Visit (INDEPENDENT_AMBULATORY_CARE_PROVIDER_SITE_OTHER): Payer: Commercial Managed Care - HMO | Admitting: Family Medicine

## 2022-05-02 VITALS — BP 125/78 | HR 77 | Temp 98.7°F | Ht 73.5 in | Wt 304.0 lb

## 2022-05-02 DIAGNOSIS — E889 Metabolic disorder, unspecified: Secondary | ICD-10-CM

## 2022-05-02 DIAGNOSIS — E785 Hyperlipidemia, unspecified: Secondary | ICD-10-CM | POA: Diagnosis not present

## 2022-05-02 DIAGNOSIS — E1169 Type 2 diabetes mellitus with other specified complication: Secondary | ICD-10-CM

## 2022-05-02 DIAGNOSIS — E639 Nutritional deficiency, unspecified: Secondary | ICD-10-CM

## 2022-05-02 DIAGNOSIS — I10 Essential (primary) hypertension: Secondary | ICD-10-CM

## 2022-05-02 DIAGNOSIS — G5600 Carpal tunnel syndrome, unspecified upper limb: Secondary | ICD-10-CM | POA: Insufficient documentation

## 2022-05-02 DIAGNOSIS — M109 Gout, unspecified: Secondary | ICD-10-CM | POA: Insufficient documentation

## 2022-05-02 LAB — BAYER DCA HB A1C WAIVED: HB A1C (BAYER DCA - WAIVED): 9.2 % — ABNORMAL HIGH (ref 4.8–5.6)

## 2022-05-02 MED ORDER — GLIPIZIDE 5 MG PO TABS
5.0000 mg | ORAL_TABLET | Freq: Every day | ORAL | 0 refills | Status: DC
Start: 2022-05-02 — End: 2022-07-02

## 2022-05-02 MED ORDER — LOSARTAN POTASSIUM 100 MG PO TABS
100.0000 mg | ORAL_TABLET | Freq: Every day | ORAL | 0 refills | Status: DC
Start: 2022-05-02 — End: 2022-06-30

## 2022-05-02 MED ORDER — ALLOPURINOL 100 MG PO TABS: ORAL_TABLET | ORAL | 0 refills | Status: DC

## 2022-05-02 MED ORDER — OZEMPIC (0.25 OR 0.5 MG/DOSE) 2 MG/3ML ~~LOC~~ SOPN
0.5000 mg | PEN_INJECTOR | SUBCUTANEOUS | 0 refills | Status: DC
Start: 2022-06-01 — End: 2022-05-07

## 2022-05-02 MED ORDER — SEMAGLUTIDE(0.25 OR 0.5MG/DOS) 2 MG/3ML ~~LOC~~ SOPN
0.2500 mg | PEN_INJECTOR | SUBCUTANEOUS | 0 refills | Status: DC
Start: 2022-05-02 — End: 2022-05-07

## 2022-05-02 MED ORDER — SEMAGLUTIDE (1 MG/DOSE) 4 MG/3ML ~~LOC~~ SOPN
1.0000 mg | PEN_INJECTOR | SUBCUTANEOUS | 0 refills | Status: DC
Start: 2022-07-01 — End: 2022-05-07

## 2022-05-02 NOTE — Patient Instructions (Signed)

## 2022-05-02 NOTE — Progress Notes (Signed)
Acute Office Visit  Subjective:  Patient ID: Erik Davis, male    DOB: [birth year], 42 y.o.   MRN: [id number]  Chief Complaint  Patient presents with   New Patient (Initial Visit)    Hypertension Borderline diabetes    HPI Patient is in today to establish care and follow up on diabetes and HTN.   Prediabetes  Glucometer:Accucheck  High at home: 200; Low at home: 120, Taking medication(s): glipizide and metformin, tolerating well.  Last eye exam: not completed  Last foot exam: due  Last A1c:  Lab Results  Component Value Date   HGBA1C 6.4 02/22/2020   Nephropathy screen indicated?: due  Last flu, zoster and/or pneumovax:  Immunization History  Administered Date(s) Administered   Influenza Split 10/16/2019   Influenza,inj,Quad PF,6+ Mos 09/16/2016, 10/28/2017   PFIZER(Purple Top)SARS-COV-2 Vaccination 06/01/2019, 06/22/2019, 12/23/2019   Tdap 06/04/2013   HTN Checks his BP at home. Has been running <130/80s. Reports that sometimes it is 138/80s. Has the omron cuff at home.  Denies changes to vision, headaches.   ROS: Denies dizziness, LOC  Endorses polyuria, polydipsia,  Denies unintended weight loss/gain, foot ulcerations, numbness or tingling in extremities, shortness of breath or chest pain. As per HPI  Objective:  BP 125/78   Pulse 77   Temp 98.7 F (37.1 C)   Ht 6' 1.5" (1.867 m)   Wt (!) 304 lb (137.9 kg)   SpO2 97%   BMI 39.56 kg/m   Physical Exam Constitutional:      General: He is not in acute distress.    Appearance: Normal appearance. He is not ill-appearing, toxic-appearing or diaphoretic.  Cardiovascular:     Rate and Rhythm: Normal rate.     Pulses: Normal pulses.     Heart sounds: Normal heart sounds. No murmur heard.    No gallop.  Pulmonary:     Effort: Pulmonary effort is normal. No respiratory distress.     Breath sounds: Normal breath sounds. No stridor. No wheezing, rhonchi or rales.  Feet:     Right foot:     Protective  Sensation: 10 sites tested.  10 sites sensed.     Skin integrity: Skin integrity normal.     Toenail Condition: Right toenails are normal.     Left foot:     Protective Sensation: 10 sites tested.  10 sites sensed.     Skin integrity: Skin integrity normal.     Toenail Condition: Left toenails are normal.  Skin:    General: Skin is warm.     Capillary Refill: Capillary refill takes less than 2 seconds.  Neurological:     General: No focal deficit present.     Mental Status: He is alert and oriented to person, place, and time. Mental status is at baseline.     Motor: No weakness.  Psychiatric:        Mood and Affect: Mood normal.        Behavior: Behavior normal.        Thought Content: Thought content normal.        Judgment: Judgment normal.    Assessment & Plan:  1. Type 2 diabetes mellitus with other specified complication, without long-term current use of insulin (HCC) Patient now in Diabetic range with A1C of 9.2%. Discussed with patient starting Ozempic. Patient comfortable with self-injection. Patient would like to go to nutrition for education. Discussed with patient side effects of medications. Will collect microalbumin and additional labs in future when patient makes  lab appt. Discussed continuing checking BG fasting daily.  - Bayer DCA Hb A1c Waived - Amb ref to Medical Nutrition Therapy-MNT - Semaglutide,0.25 or 0.5MG /DOS, 2 MG/3ML SOPN; Inject 0.25 mg into the skin once a week for 4 doses.  Dispense: 3 mL; Refill: 0 - Semaglutide,0.25 or 0.5MG /DOS, (OZEMPIC, 0.25 OR 0.5 MG/DOSE,) 2 MG/3ML SOPN; Inject 0.5 mg into the skin once a week.  Dispense: 3 mL; Refill: 0 - Semaglutide, 1 MG/DOSE, 4 MG/3ML SOPN; Inject 1 mg as directed once a week.  Dispense: 3 mL; Refill: 0 - glipiZIDE (GLUCOTROL) 5 MG tablet; Take 1 tablet (5 mg total) by mouth daily before breakfast for 90 doses.  Dispense: 90 tablet; Refill: 0 - Microalbumin / creatinine urine ratio; Future  2. Primary  hypertension Well controlled on current regimen. Refill as below. Labs as below. Will communicate results to patient once available.  - losartan (COZAAR) 100 MG tablet; Take 1 tablet (100 mg total) by mouth daily for 90 doses.  Dispense: 90 tablet; Refill: 0 - CBC with Differential/Platelet; Future - CMP14+EGFR; Future  3. Hyperlipidemia associated with type 2 diabetes mellitus (HCC) Labs as below. Will communicate results to patient once available.  Not on statin at this time.  - Lipid panel; Future  4. Metabolic and nutritional disorder Labs as below. Will communicate results to patient once available.  - TSH; Future  The above assessment and management plan was discussed with the patient. The patient verbalized understanding of and has agreed to the management plan using shared-decision making. Patient is aware to call the clinic if they develop any new symptoms or if symptoms fail to improve or worsen. Patient is aware when to return to the clinic for a follow-up visit. Patient educated on when it is appropriate to go to the emergency department.   Neale Burly, DNP-FNP [facility] [address] [place], [place] [address] [phone]

## 2022-05-05 ENCOUNTER — Telehealth: Payer: Self-pay | Admitting: Family Medicine

## 2022-05-05 ENCOUNTER — Other Ambulatory Visit: Payer: Commercial Managed Care - HMO

## 2022-05-05 DIAGNOSIS — E1169 Type 2 diabetes mellitus with other specified complication: Secondary | ICD-10-CM

## 2022-05-05 DIAGNOSIS — I10 Essential (primary) hypertension: Secondary | ICD-10-CM

## 2022-05-05 DIAGNOSIS — E639 Nutritional deficiency, unspecified: Secondary | ICD-10-CM

## 2022-05-05 LAB — CMP14+EGFR

## 2022-05-05 LAB — CBC WITH DIFFERENTIAL/PLATELET
EOS (ABSOLUTE): 0.1 10*3/uL (ref 0.0–0.4)
MCH: 29.2 pg (ref 26.6–33.0)
Monocytes: 7 %
Neutrophils Absolute: 2 10*3/uL (ref 1.4–7.0)
RDW: 12 % (ref 11.6–15.4)
WBC: 5.3 10*3/uL (ref 3.4–10.8)

## 2022-05-05 LAB — LIPID PANEL

## 2022-05-05 LAB — TSH

## 2022-05-05 NOTE — Telephone Encounter (Signed)
Pt says that insurance will not cover Semaglutide,0.25 or 0.5MG /DOS, 2 MG/3ML SOPN It is $500 a month until Deductible is met. Pt wants to let PCP know and what other options for there for him? Please call back

## 2022-05-06 LAB — CBC WITH DIFFERENTIAL/PLATELET
Basophils Absolute: 0.1 10*3/uL (ref 0.0–0.2)
Basos: 1 %
Eos: 3 %
Hematocrit: 43.6 % (ref 37.5–51.0)
Hemoglobin: 14.7 g/dL (ref 13.0–17.7)
Immature Grans (Abs): 0 10*3/uL (ref 0.0–0.1)
Immature Granulocytes: 0 %
Lymphocytes Absolute: 2.8 10*3/uL (ref 0.7–3.1)
Lymphs: 51 %
MCHC: 33.7 g/dL (ref 31.5–35.7)
MCV: 87 fL (ref 79–97)
Monocytes Absolute: 0.4 10*3/uL (ref 0.1–0.9)
Neutrophils: 38 %
Platelets: 250 10*3/uL (ref 150–450)
RBC: 5.04 x10E6/uL (ref 4.14–5.80)

## 2022-05-06 LAB — CMP14+EGFR
AST: 44 IU/L — ABNORMAL HIGH (ref 0–40)
Albumin/Globulin Ratio: 1.5 (ref 1.2–2.2)
Alkaline Phosphatase: 84 IU/L (ref 44–121)
BUN/Creatinine Ratio: 10 (ref 9–20)
BUN: 12 mg/dL (ref 6–24)
Chloride: 99 mmol/L (ref 96–106)
Creatinine, Ser: 1.22 mg/dL (ref 0.76–1.27)
Globulin, Total: 3 g/dL (ref 1.5–4.5)
Glucose: 318 mg/dL — ABNORMAL HIGH (ref 70–99)
Potassium: 4.5 mmol/L (ref 3.5–5.2)
Total Protein: 7.6 g/dL (ref 6.0–8.5)
eGFR: 75 mL/min/{1.73_m2} (ref >59)

## 2022-05-06 LAB — MICROALBUMIN / CREATININE URINE RATIO
Creatinine, Urine: 192.6 mg/dL
Microalb/Creat Ratio: 6 mg/g creat (ref 0–29)
Microalbumin, Urine: 12.2 ug/mL

## 2022-05-06 LAB — LIPID PANEL
HDL: 31 mg/dL — ABNORMAL LOW (ref >39)
LDL Chol Calc (NIH): 138 mg/dL — ABNORMAL HIGH (ref 0–99)
Triglycerides: 183 mg/dL — ABNORMAL HIGH (ref 0–149)

## 2022-05-06 NOTE — Telephone Encounter (Signed)
Patient's insurance will cover Trulicity only.  He was told this yesterday by his insurance company.

## 2022-05-07 MED ORDER — TRULICITY 0.75 MG/0.5ML ~~LOC~~ SOAJ: 0.7500 mg | SUBCUTANEOUS | 0 refills | Status: AC

## 2022-05-07 NOTE — Telephone Encounter (Signed)
Patient aware and verbalized understanding. °

## 2022-05-13 ENCOUNTER — Telehealth: Payer: Self-pay | Admitting: Family Medicine

## 2022-05-13 NOTE — Telephone Encounter (Signed)
Pt aware pharmacy received script for Trulicity [facility] informed me that it was not covered and needed a PA

## 2022-05-14 ENCOUNTER — Other Ambulatory Visit (HOSPITAL_COMMUNITY): Payer: Self-pay

## 2022-05-14 ENCOUNTER — Telehealth: Payer: Self-pay

## 2022-05-14 NOTE — Telephone Encounter (Signed)
Pharmacy Patient Advocate Encounter   Received notification that prior authorization for Trulicity 0.75MG /0.5ML pen-injectors is required/requested.   PA submitted on 05/14/22 to (ins) [employer] via CoverMyMeds Key or (Medicaid) confirmation # [id number] Status is pending

## 2022-05-14 NOTE — Telephone Encounter (Signed)
Patient Advocate Encounter  Prior Authorization for Trulicity 0.75MG /0.5ML pen-injectors has been approved with [employer].    PA# [id number] Effective dates: 05/14/22 through 05/14/23  Per [facility] test claim, copay for 28 days supply is $25

## 2022-05-14 NOTE — Telephone Encounter (Signed)
Created new encounter for PA. Will route back to pool once determination has been made.

## 2022-05-21 ENCOUNTER — Ambulatory Visit: Payer: Self-pay | Admitting: Family Medicine

## 2022-06-06 ENCOUNTER — Telehealth (INDEPENDENT_AMBULATORY_CARE_PROVIDER_SITE_OTHER): Payer: Commercial Managed Care - HMO | Admitting: Family Medicine

## 2022-06-06 ENCOUNTER — Encounter: Payer: Self-pay | Admitting: Family Medicine

## 2022-06-06 DIAGNOSIS — E1169 Type 2 diabetes mellitus with other specified complication: Secondary | ICD-10-CM

## 2022-06-06 DIAGNOSIS — Z7985 Long-term (current) use of injectable non-insulin antidiabetic drugs: Secondary | ICD-10-CM | POA: Diagnosis not present

## 2022-06-06 DIAGNOSIS — E785 Hyperlipidemia, unspecified: Secondary | ICD-10-CM | POA: Insufficient documentation

## 2022-06-06 MED ORDER — DULAGLUTIDE 3 MG/0.5ML ~~LOC~~ SOAJ
3.0000 mg | SUBCUTANEOUS | 0 refills | Status: DC
Start: 2022-07-06 — End: 2022-07-02

## 2022-06-06 MED ORDER — ONETOUCH ULTRASOFT LANCETS MISC: 3 refills | Status: DC

## 2022-06-06 MED ORDER — BLOOD GLUCOSE TEST VI STRP
1.0000 | ORAL_STRIP | Freq: Three times a day (TID) | 0 refills | Status: DC
Start: 2022-06-06 — End: 2022-06-30

## 2022-06-06 MED ORDER — TRULICITY 1.5 MG/0.5ML ~~LOC~~ SOAJ
1.5000 mg | SUBCUTANEOUS | 0 refills | Status: DC
Start: 2022-06-06 — End: 2022-06-30

## 2022-06-06 MED ORDER — ONETOUCH VERIO FLEX SYSTEM W/DEVICE KIT: PACK | 0 refills | Status: AC

## 2022-06-06 NOTE — Addendum Note (Signed)
Addended by: Neale Burly on: 06/06/2022 08:47 AM   Modules accepted: Orders

## 2022-06-06 NOTE — Progress Notes (Signed)
Fax from [employer], needing script for OneTouch verio meter and lancets. Sent script

## 2022-06-06 NOTE — Progress Notes (Addendum)
Virtual Visit via Video Note  I connected with Erik Davis on 06/06/22 at  8:35 AM EDT by a video enabled telemedicine application and verified that I am speaking with the correct person using two identifiers.  Patient Location: Other:  work  Dispensing optician: Office/Clinic  I discussed the limitations, risks, security, and privacy concerns of performing an evaluation and management service by video and the availability of in person appointments. I also discussed with the patient that there may be a patient responsible charge related to this service. The patient expressed understanding and agreed to proceed.  Subjective: PCP: Arrie Senate, FNP  Chief Complaint  Patient presents with   Diabetes   Diabetes  Started Trulicity, not ozempic because of insurance coverage. States that the 1st couple of shots he was sick on stomach, but was still eating large meals. Reports that he has decreased portion size of meals and symptoms have improved.  He has recently run out of test strips this week, but was previously taking his BG daily.   Type 2 Diabetes Glucometer: I care.  High at home: 200; Low at home: 101, Taking medication(s): semaglutide, glipizide,.  Last eye exam: due  Last foot exam: 05/02/22 Last A1c:  Lab Results  Component Value Date   HGBA1C 9.2 (H) 05/02/2022   Nephropathy screen indicated?: completed 05/02/22 Last flu, zoster and/or pneumovax:  Immunization History  Administered Date(s) Administered   Influenza Split 10/16/2019   Influenza,inj,Quad PF,6+ Mos 09/16/2016, 10/28/2017   PFIZER(Purple Top)SARS-COV-2 Vaccination 06/01/2019, 06/22/2019, 12/23/2019   Tdap 06/04/2013    ROS: Denies dizziness, LOC, polyuria, polydipsia, unintended weight loss/gain, foot ulcerations, numbness or tingling in extremities, shortness of breath or chest pain.  ROS: Per HPI  Current Outpatient Medications:    allopurinol (ZYLOPRIM) 100 MG tablet, Take 2 tablets by  mouth daily. Please schedule an appointment for further refills., Disp: 60 tablet, Rfl: 0   glipiZIDE (GLUCOTROL) 5 MG tablet, Take 1 tablet (5 mg total) by mouth daily before breakfast for 90 doses., Disp: 90 tablet, Rfl: 0   losartan (COZAAR) 100 MG tablet, Take 1 tablet (100 mg total) by mouth daily for 90 doses., Disp: 90 tablet, Rfl: 0  Observations/Objective: There were no vitals filed for this visit. Physical Exam Constitutional:      General: He is awake. He is not in acute distress.    Appearance: Normal appearance. He is well-developed and well-groomed. He is not ill-appearing, toxic-appearing or diaphoretic.  Pulmonary:     Effort: Pulmonary effort is normal.  Neurological:     General: No focal deficit present.     Mental Status: He is alert and oriented to person, place, and time.  Psychiatric:        Attention and Perception: Attention and perception normal.        Mood and Affect: Mood and affect normal.        Speech: Speech normal.        Behavior: Behavior normal. Behavior is cooperative.        Thought Content: Thought content normal.        Cognition and Memory: Cognition and memory normal.        Judgment: Judgment normal.    Assessment and Plan: Meredith was seen today for diabetes.  Diagnoses and all orders for this visit:  Type 2 diabetes mellitus with other specified complication, without long-term current use of insulin (HCC) Tolerating Trulicity well. PA created after last visit. Will increase dose as below. Will  plan for follow up in 2 months in person to complete labs. Will reorder test strips as below for patient to obtain daily BG in am, fasting.  -     Dulaglutide (TRULICITY) 1.5 MG/0.5ML SOPN; Inject 1.5 mg into the skin once a week for 4 doses. -     Dulaglutide 3 MG/0.5ML SOPN; Inject 3 mg into the skin once a week for 4 doses. - Glucose Blood (BLOOD GLUCOSE TEST STRIPS) STRP; 1 each by In Vitro route 3 (three) times daily with meals. May substitute  to any manufacturer covered by patient's insurance. - uses [employer]  Dispense: 100 strip; Refill: 0  The above assessment and management plan was discussed with the patient. The patient verbalized understanding of and has agreed to the management plan using shared-decision making. Patient is aware to call the clinic if they develop any new symptoms or if symptoms fail to improve or worsen. Patient is aware when to return to the clinic for a follow-up visit. Patient educated on when it is appropriate to go to the emergency department.   Follow Up Instructions: Return in about 2 months (around 08/06/2022) for Chronic Condition Follow up with labs .   Neale Burly, DNP-FNP [facility] [address] [place], [place] [address] [phone]

## 2022-06-06 NOTE — Addendum Note (Signed)
Addended by: Julious Payer D on: 06/06/2022 10:13 AM   Modules accepted: Orders

## 2022-06-18 ENCOUNTER — Ambulatory Visit: Payer: 59 | Admitting: Nutrition

## 2022-06-28 ENCOUNTER — Other Ambulatory Visit: Payer: Self-pay | Admitting: Family Medicine

## 2022-06-28 DIAGNOSIS — E1169 Type 2 diabetes mellitus with other specified complication: Secondary | ICD-10-CM

## 2022-06-30 ENCOUNTER — Other Ambulatory Visit: Payer: Self-pay | Admitting: *Deleted

## 2022-06-30 DIAGNOSIS — I10 Essential (primary) hypertension: Secondary | ICD-10-CM

## 2022-06-30 MED ORDER — ALLOPURINOL 100 MG PO TABS: ORAL_TABLET | ORAL | 0 refills | Status: DC

## 2022-06-30 MED ORDER — ONETOUCH VERIO VI STRP: ORAL_STRIP | 3 refills | Status: AC

## 2022-06-30 MED ORDER — ONETOUCH ULTRASOFT LANCETS MISC: 3 refills | Status: AC

## 2022-06-30 MED ORDER — LOSARTAN POTASSIUM 100 MG PO TABS
100.0000 mg | ORAL_TABLET | Freq: Every day | ORAL | 0 refills | Status: DC
Start: 2022-06-30 — End: 2023-01-21

## 2022-07-01 ENCOUNTER — Ambulatory Visit: Payer: Self-pay | Admitting: Family Medicine

## 2022-07-02 ENCOUNTER — Other Ambulatory Visit: Payer: Self-pay | Admitting: *Deleted

## 2022-07-02 DIAGNOSIS — E1169 Type 2 diabetes mellitus with other specified complication: Secondary | ICD-10-CM

## 2022-07-02 MED ORDER — DULAGLUTIDE 3 MG/0.5ML ~~LOC~~ SOPN
3.0000 mg | PEN_INJECTOR | SUBCUTANEOUS | 0 refills | Status: AC
Start: 2022-07-06 — End: 2022-07-28

## 2022-07-02 MED ORDER — GLIPIZIDE 5 MG PO TABS
5.0000 mg | ORAL_TABLET | Freq: Every day | ORAL | 0 refills | Status: DC
Start: 2022-07-02 — End: 2023-01-21

## 2022-07-02 MED ORDER — TRULICITY 1.5 MG/0.5ML ~~LOC~~ SOAJ
SUBCUTANEOUS | 0 refills | Status: DC
Start: 2022-07-02 — End: 2023-01-21

## 2022-07-30 ENCOUNTER — Other Ambulatory Visit: Payer: Self-pay | Admitting: Family Medicine

## 2022-07-30 DIAGNOSIS — I10 Essential (primary) hypertension: Secondary | ICD-10-CM

## 2022-08-08 ENCOUNTER — Encounter: Payer: Self-pay | Admitting: Family Medicine

## 2022-08-08 ENCOUNTER — Ambulatory Visit (INDEPENDENT_AMBULATORY_CARE_PROVIDER_SITE_OTHER): Payer: Commercial Managed Care - HMO | Admitting: Family Medicine

## 2022-08-08 VITALS — BP 122/77 | HR 93 | Temp 98.7°F | Ht 74.0 in | Wt 302.0 lb

## 2022-08-08 DIAGNOSIS — E1159 Type 2 diabetes mellitus with other circulatory complications: Secondary | ICD-10-CM | POA: Diagnosis not present

## 2022-08-08 DIAGNOSIS — G5603 Carpal tunnel syndrome, bilateral upper limbs: Secondary | ICD-10-CM

## 2022-08-08 DIAGNOSIS — E785 Hyperlipidemia, unspecified: Secondary | ICD-10-CM

## 2022-08-08 DIAGNOSIS — E1169 Type 2 diabetes mellitus with other specified complication: Secondary | ICD-10-CM | POA: Diagnosis not present

## 2022-08-08 DIAGNOSIS — Z7985 Long-term (current) use of injectable non-insulin antidiabetic drugs: Secondary | ICD-10-CM

## 2022-08-08 DIAGNOSIS — I152 Hypertension secondary to endocrine disorders: Secondary | ICD-10-CM

## 2022-08-08 MED ORDER — DULAGLUTIDE 3 MG/0.5ML ~~LOC~~ SOAJ: 3.0000 mg | SUBCUTANEOUS | 0 refills | Status: AC

## 2022-08-08 NOTE — Progress Notes (Signed)
Acute Office Visit  Subjective:  Patient ID: Erik Davis, male    DOB: May 27, 1980, 42 y.o.   MRN: [id number]  Chief Complaint  Patient presents with   chronic condition follow up   HPI Patient is in today for chronic condition follow up   Type 2 Diabetes with Hypertension  Glucometer: Accucheck   High at home: 135; Low at home: 90, Taking medication(s): Trulicity and Glipizide.  Asymptomatic with 90 BG  Has diarrhea with Trulicity, but states that it is manageable  Last eye exam: due  Last foot exam: 05/02/22 Last A1c:  Lab Results  Component Value Date   HGBA1C 9.2 (H) 05/02/2022   Nephropathy screen indicated?: no  Last flu, zoster and/or pneumovax:  Immunization History  Administered Date(s) Administered   Influenza Split 10/16/2019   Influenza,inj,Quad PF,6+ Mos 09/16/2016, 10/28/2017   PFIZER(Purple Top)SARS-COV-2 Vaccination 06/01/2019, 06/22/2019, 12/23/2019   Tdap 06/04/2013   ROS: Denies dizziness, LOC, polyuria, polydipsia, unintended weight loss/gain, foot ulcerations, shortness of breath or chest pain.  Hypertension  Blood pressure monitor from [employer]  BP at home average max 130/85-90 ROS Denies anxiety, fatigue, peripheral edema, changes to vision, chest pain, headaches, palpitations, sweats, SOB, PND, orthopnea, neck pain,  Meds losartan  CAD risks male, diabetes, obesity   Hyperlipidemia  Not taking a statin or red yeast rice. Has made some dietary changes with wife.  Denies history of SCD in family   Carpal Tunnel  Reports pain, numbness and tingling in bilateral hands and fingers. Reports that he cannot sleep at night due to the irritation. Has tried holding a pillow at night to relieve the pain.  Has also tried a brace at night but that did not help.  Takes aleve in the mornings and it helps sometimes.   ROS As per HPI  Objective:  BP 122/77   Pulse 93   Temp 98.7 F (37.1 C)   Ht 6\' 2"  (1.88 m)   Wt (!) 302 lb (137 kg)   SpO2 96%   BMI  38.77 kg/m   Physical Exam Constitutional:      General: He is awake. He is not in acute distress.    Appearance: Normal appearance. He is well-developed and well-groomed. He is morbidly obese. He is not ill-appearing, toxic-appearing or diaphoretic.  Cardiovascular:     Rate and Rhythm: Normal rate and regular rhythm.     Pulses: Normal pulses.          Radial pulses are 2+ on the right side and 2+ on the left side.       Posterior tibial pulses are 2+ on the right side and 2+ on the left side.     Heart sounds: Normal heart sounds. No murmur heard.    No gallop.  Pulmonary:     Effort: Pulmonary effort is normal. No respiratory distress.     Breath sounds: Normal breath sounds. No stridor. No wheezing, rhonchi or rales.  Musculoskeletal:     Cervical back: Full passive range of motion without pain and neck supple.     Right lower leg: No edema.     Left lower leg: No edema.  Skin:    General: Skin is warm.     Capillary Refill: Capillary refill takes less than 2 seconds.  Neurological:     General: No focal deficit present.     Mental Status: He is alert, oriented to person, place, and time and easily aroused. Mental status is at baseline.  GCS: GCS eye subscore is 4. GCS verbal subscore is 5. GCS motor subscore is 6.     Motor: No weakness.  Psychiatric:        Attention and Perception: Attention and perception normal.        Mood and Affect: Mood and affect normal.        Speech: Speech normal.        Behavior: Behavior normal. Behavior is cooperative.        Thought Content: Thought content normal. Thought content does not include homicidal or suicidal ideation. Thought content does not include homicidal or suicidal plan.        Cognition and Memory: Cognition and memory normal.        Judgment: Judgment normal.       08/08/2022    3:56 PM 05/02/2022    2:12 PM 08/12/2018    3:24 PM  Depression screen PHQ 2/9  Decreased Interest 0 0 0  Down, Depressed, Hopeless 0 0 0   PHQ - 2 Score 0 0 0  Altered sleeping 0 2   Tired, decreased energy 0 0   Change in appetite 0 0   Feeling bad or failure about yourself  0 0   Trouble concentrating 0 0   Moving slowly or fidgety/restless 0 0   Suicidal thoughts 0 0   PHQ-9 Score 0 2   Difficult doing work/chores Not difficult at all        08/08/2022    3:56 PM 05/02/2022    2:12 PM  GAD 7 : Generalized Anxiety Score  Nervous, Anxious, on Edge 0 0  Control/stop worrying 0 0  Worry too much - different things 0 0  Trouble relaxing 0 0  Restless 0 0  Easily annoyed or irritable 0 0  Afraid - awful might happen 0 0  Total GAD 7 Score 0 0  Anxiety Difficulty Not difficult at all Not difficult at all   The 10-year ASCVD risk score (Arnett DK, et al., 2019) is: 6.3%   Values used to calculate the score:     Age: 67 years     Sex: Male     Is Non-Hispanic African American: No     Diabetic: Yes     Tobacco smoker: No     Systolic Blood Pressure: 122 mmHg     Is BP treated: Yes     HDL Cholesterol: 31 mg/dL     Total Cholesterol: 202 mg/dL Assessment & Plan:  1. Type 2 diabetes mellitus with other specified complication, without long-term current use of insulin (HCC) Labs as below. Will communicate results to patient once available. Will await results to determine next steps. Will increase to 3 mg Trulicity once patient completes current dose. Will slowly titrate up as patient is having slight side effects.  - Dulaglutide 3 MG/0.5ML SOPN; Inject 3 mg into the skin once a week for 8 doses.  Dispense: 4 mL; Refill: 0 - Bayer DCA Hb A1c Waived  2. Hypertension associated with type 2 diabetes mellitus (HCC) Well controlled. Continue current regimen.   3. Bilateral carpal tunnel syndrome Will have patient return next week for injection. Do not want to start oral steroid at this time due to diabetes.   4. Hyperlipidemia associated with type 2 diabetes mellitus (HCC) Discussed using red yeast rice. Patient is in  intermittent category of ASCVD risk score. Will plan to recheck at next appt when patient is fasting.   5. Morbid obesity (HCC)  Discussed with patient to continue healthy lifestyle choices, including diet (rich in fruits, vegetables, and lean proteins, and low in salt and simple carbohydrates) and exercise (at least 30 minutes of moderate physical activity daily). Limit beverages high is sugar. Recommended at least 80-100 oz of water daily.   The above assessment and management plan was discussed with the patient. The patient verbalized understanding of and has agreed to the management plan using shared-decision making. Patient is aware to call the clinic if they develop any new symptoms or if symptoms fail to improve or worsen. Patient is aware when to return to the clinic for a follow-up visit. Patient educated on when it is appropriate to go to the emergency department.   Return in about 3 months (around 11/08/2022) for Chronic Condition Follow up.  Neale Burly, DNP-FNP [facility] [address] [place], [place] [address] [phone]

## 2022-08-12 NOTE — Progress Notes (Signed)
A1C well in goal!! Continue current regimen. Monitor for low BG.

## 2022-08-14 ENCOUNTER — Ambulatory Visit: Payer: Commercial Managed Care - HMO | Admitting: Family Medicine

## 2022-08-14 ENCOUNTER — Other Ambulatory Visit (HOSPITAL_COMMUNITY): Payer: Self-pay

## 2022-08-14 ENCOUNTER — Telehealth: Payer: Self-pay | Admitting: Pharmacy Technician

## 2022-08-14 NOTE — Telephone Encounter (Signed)
Pharmacy Patient Advocate Encounter   Received notification from CoverMyMeds that prior authorization for Trulicity 3MG /0.5ML pen-injectors is required/requested.   Insurance verification completed.   The patient is insured through [employer] .   Per test claim: PA required; PA submitted to [employer] via CoverMyMeds Key/confirmation #/EOC [facility] Status is pending

## 2022-08-14 NOTE — Telephone Encounter (Signed)
Pharmacy Patient Advocate Encounter  Received notification from [employer] that Prior Authorization for Trulicity has been APPROVED from 07/31/22 to 09/14/22   PA #/Case ID/Reference #: [id number]

## 2022-08-15 ENCOUNTER — Ambulatory Visit (INDEPENDENT_AMBULATORY_CARE_PROVIDER_SITE_OTHER): Payer: Commercial Managed Care - HMO | Admitting: Family Medicine

## 2022-08-15 DIAGNOSIS — G5603 Carpal tunnel syndrome, bilateral upper limbs: Secondary | ICD-10-CM

## 2022-08-15 MED ORDER — METHYLPREDNISOLONE ACETATE 40 MG/ML IJ SUSP
20.0000 mg | Freq: Once | INTRAMUSCULAR | Status: AC
Start: 2022-08-15 — End: 2022-08-15
  Administered 2022-08-15: 20 mg

## 2022-08-15 NOTE — Progress Notes (Signed)
Acute Office Visit  Subjective:  Patient ID: Erik Davis, male    DOB: 1980/07/05, 42 y.o.   MRN: [id number]  Chief Complaint  Patient presents with   Procedure    Bilateral carpal tunnel injections   HPI Patient is in today for carpal tunnel pain Started 10 years ago.  Has worsened in the last 2 months.  Has bilateral braces at home that he wears during sleep.  Wakes up due the pain States that he cannot use his hands for the first 30 minutes of the day.  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  Has had injections twice before. Last injection >1 year ago.   ROS As per HPI  Objective:  BP 133/77   Pulse 83   Temp 98.7 F (37.1 C)   Ht 6\' 2"  (1.88 m)   Wt 300 lb (136.1 kg)   SpO2 97%   BMI 38.52 kg/m   Physical Exam Constitutional:      General: He is awake. He is not in acute distress.    Appearance: Normal appearance. He is well-developed and well-groomed. He is not ill-appearing, toxic-appearing or diaphoretic.  Cardiovascular:     Rate and Rhythm: Normal rate.     Pulses: Normal pulses.          Radial pulses are 2+ on the right side and 2+ on the left side.       Posterior tibial pulses are 2+ on the right side and 2+ on the left side.     Heart sounds: Normal heart sounds. No murmur heard.    No gallop.  Pulmonary:     Effort: Pulmonary effort is normal. No respiratory distress.     Breath sounds: Normal breath sounds. No stridor. No wheezing, rhonchi or rales.  Musculoskeletal:     Cervical back: Full passive range of motion without pain and neck supple.     Right lower leg: No edema.     Left lower leg: No edema.     Comments: Positive phalen test   Skin:    General: Skin is warm.     Capillary Refill: Capillary refill takes less than 2 seconds.  Neurological:     General: No focal deficit present.     Mental Status: He is alert, oriented to person, place, and time and easily aroused. Mental status is at baseline.     GCS: GCS eye  subscore is 4. GCS verbal subscore is 5. GCS motor subscore is 6.     Motor: No weakness.  Psychiatric:        Attention and Perception: Attention and perception normal.        Mood and Affect: Mood and affect normal.        Speech: Speech normal.        Behavior: Behavior normal. Behavior is cooperative.        Thought Content: Thought content normal. Thought content does not include homicidal or suicidal ideation. Thought content does not include homicidal or suicidal plan.        Cognition and Memory: Cognition and memory normal.        Judgment: Judgment normal.        08/08/2022    3:56 PM 05/02/2022    2:12 PM 08/12/2018    3:24 PM  Depression screen PHQ 2/9  Decreased Interest 0 0 0  Down, Depressed, Hopeless 0 0 0  PHQ - 2 Score 0 0 0  Altered sleeping 0 2  Tired, decreased energy 0 0   Change in appetite 0 0   Feeling bad or failure about yourself  0 0   Trouble concentrating 0 0   Moving slowly or fidgety/restless 0 0   Suicidal thoughts 0 0   PHQ-9 Score 0 2   Difficult doing work/chores Not difficult at all        08/08/2022    3:56 PM 05/02/2022    2:12 PM  GAD 7 : Generalized Anxiety Score  Nervous, Anxious, on Edge 0 0  Control/stop worrying 0 0  Worry too much - different things 0 0  Trouble relaxing 0 0  Restless 0 0  Easily annoyed or irritable 0 0  Afraid - awful might happen 0 0  Total GAD 7 Score 0 0  Anxiety Difficulty Not difficult at all Not difficult at all   Hand/Upper Extremity Injection/Arthrocentesis: bilateral carpal tunnel for carpal tunnel syndrome on 08/15/2022 1:57 PM Indications: pain Details: 25 G needle, ulnar approach Medications (Right): (20 Depo Medrol with 1% Lidocaine = .75 mL total) Aspirate (Right): 0 mL Medications (Left): (20 Depo Medrol with 1% Lidocaine = .75 mL total ) Aspirate (Left): 0 mL Outcome: tolerated well, no immediate complications  Performed with Kari Baars, NP present  Procedure, treatment alternatives,  risks and benefits explained, specific risks discussed. Consent was given by the patient. Patient was prepped and draped in the usual sterile fashion.     Assessment & Plan:  1. Bilateral carpal tunnel syndrome Bilateral injections performed in office. Discussed with patient to continue to wear braces at home nightly.  - methylPREDNISolone acetate (DEPO-MEDROL) injection 20 mg - methylPREDNISolone acetate (DEPO-MEDROL) injection 20 mg   The above assessment and management plan was discussed with the patient. The patient verbalized understanding of and has agreed to the management plan using shared-decision making. Patient is aware to call the clinic if they develop any new symptoms or if symptoms fail to improve or worsen. Patient is aware when to return to the clinic for a follow-up visit. Patient educated on when it is appropriate to go to the emergency department.   Return if symptoms worsen or fail to improve.  Neale Burly, DNP-FNP [facility] [address] [place], [place] [address] [phone]

## 2022-09-22 ENCOUNTER — Ambulatory Visit: Payer: Commercial Managed Care - HMO

## 2022-12-02 ENCOUNTER — Ambulatory Visit: Payer: Commercial Managed Care - HMO

## 2023-01-21 ENCOUNTER — Ambulatory Visit (INDEPENDENT_AMBULATORY_CARE_PROVIDER_SITE_OTHER): Payer: No Typology Code available for payment source | Admitting: Family Medicine

## 2023-01-21 ENCOUNTER — Encounter: Payer: Self-pay | Admitting: Family Medicine

## 2023-01-21 VITALS — BP 125/77 | HR 100 | Temp 98.2°F | Ht 74.0 in | Wt 286.0 lb

## 2023-01-21 DIAGNOSIS — M1A00X0 Idiopathic chronic gout, unspecified site, without tophus (tophi): Secondary | ICD-10-CM | POA: Diagnosis not present

## 2023-01-21 DIAGNOSIS — E785 Hyperlipidemia, unspecified: Secondary | ICD-10-CM | POA: Diagnosis not present

## 2023-01-21 DIAGNOSIS — E1169 Type 2 diabetes mellitus with other specified complication: Secondary | ICD-10-CM

## 2023-01-21 DIAGNOSIS — I152 Hypertension secondary to endocrine disorders: Secondary | ICD-10-CM

## 2023-01-21 DIAGNOSIS — Z7985 Long-term (current) use of injectable non-insulin antidiabetic drugs: Secondary | ICD-10-CM

## 2023-01-21 DIAGNOSIS — Z0001 Encounter for general adult medical examination with abnormal findings: Secondary | ICD-10-CM

## 2023-01-21 DIAGNOSIS — E1159 Type 2 diabetes mellitus with other circulatory complications: Secondary | ICD-10-CM

## 2023-01-21 DIAGNOSIS — Z0000 Encounter for general adult medical examination without abnormal findings: Secondary | ICD-10-CM

## 2023-01-21 LAB — BAYER DCA HB A1C WAIVED: HB A1C (BAYER DCA - WAIVED): 11.5 % — ABNORMAL HIGH (ref 4.8–5.6)

## 2023-01-21 LAB — LIPID PANEL

## 2023-01-21 MED ORDER — ALLOPURINOL 100 MG PO TABS: ORAL_TABLET | ORAL | 0 refills | Status: DC

## 2023-01-21 MED ORDER — TRULICITY 1.5 MG/0.5ML ~~LOC~~ SOAJ: 1.5000 mg | SUBCUTANEOUS | 0 refills | Status: AC

## 2023-01-21 MED ORDER — TRULICITY 3 MG/0.5ML ~~LOC~~ SOAJ: 3.0000 mg | SUBCUTANEOUS | 0 refills | Status: DC

## 2023-01-21 MED ORDER — TRULICITY 0.75 MG/0.5ML ~~LOC~~ SOAJ
0.7500 mg | SUBCUTANEOUS | 0 refills | Status: AC
Start: 2023-01-21 — End: 2023-02-12

## 2023-01-21 MED ORDER — LOSARTAN POTASSIUM 25 MG PO TABS
25.0000 mg | ORAL_TABLET | Freq: Every day | ORAL | 0 refills | Status: DC
Start: 2023-01-21 — End: 2023-05-07

## 2023-01-21 NOTE — Progress Notes (Signed)
 Elevated A1C. Recommend patient restart trulicity  and glipizide . Continue to monitor BG at home. Will also reach out to pharmacy to see if she has additional recommendations.

## 2023-01-21 NOTE — Progress Notes (Signed)
 Erik Davis is a 43 y.o. male presents to office today for annual physical exam examination.    Concerns today include: 1. States that he is out of all medications. States that he has been out since November due to insurance coverage.  States that BG at home is up to 350 and he is urinating more. Reports that he feels more sluggish as well. Reports that he is continuing to lose weight. They are working on diet and lifestyle changes. He is monitoring BP at home a few times per week. States that BP is not very high. Averaging 130s SBP.   Occupation: works as site Production designer, theatre/television/film  Marital status: married, no concern for STI Diet: cooks at home. Spaghetti, meat, vegetables  Exercise: busy at work on site  Substance use: none Last eye exam: due  Last dental exam: due  Last colonoscopy: due in 2026 Contraceptive: does not have interest in vasectomy at this time  PSA: no significant history  Refills needed today: all meds  Other specialists seen: none  Dermatology exam: not interested  Fasting today:  no - breakfast at 8:30  Immunizations needed: Flu Vaccine: completed   Tdap Vaccine: due in May 2025  - every 21yrs - (<3 lifetime doses or unknown): all wounds -- look up need for Tetanus IG - (>=3 lifetime doses): clean/minor wound if >64yrs from previous; all other wounds if >22yrs from previous Zoster Vaccine: not due (those >50yo, once) Pneumonia Vaccine: would like to take now  (those w/ risk factors) - (<48yr) Both: Immunocompromised, cochlear implant, CSF leak, asplenic, sickle cell, Chronic Renal Failure - (<3yr) PPSV-23 only: Heart dz, lung disease, DM, tobacco abuse, alcoholism, cirrhosis/liver disease. - (>32yr): PPSV13 then PPSV23 in 6-12mths;  - (>33yr): repeat PPSV23 once if pt received prior to 43yo and 40yrs have passed   Past Medical History:  Diagnosis Date   Adult ADHD    managed by psychiatrist up until 09/2016   Bipolar affective disorder (HCC)    managed by psych up  until 09/2016   Borderline hyperlipidemia 04/2017   Diabetes mellitus (HCC) 2018   2018 HbA1c 5.9%.  Random gluc >200 and Hba1c 6.4% 02/2020.   Essential hypertension 2018   Fatty liver 2010   Noted on CT abd   Gout    two episodes1 yr apart--big toe.  Never been on preventative meds.   Nonspecific mesenteric adenitis 2010   Hx of   Obesity, Class II, BMI 35-39.9    Social History   Socioeconomic History   Marital status: Married    Spouse name: Not on file   Number of children: Not on file   Years of education: Not on file   Highest education level: 12th grade  Occupational History   Not on file  Tobacco Use   Smoking status: Former    Current packs/day: 0.00    Average packs/day: 0.5 packs/day for 7.0 years (3.5 ttl pk-yrs)    Types: Cigarettes    Start date: 01/06/1997    Quit date: 01/07/2004    Years since quitting: 19.0   Smokeless tobacco: Never  Vaping Use   Vaping status: Never Used  Substance and Sexual Activity   Alcohol use: Yes    Comment: rare - 2-3 beers a year   Drug use: No   Sexual activity: Not on file  Other Topics Concern   Not on file  Social History Narrative   Single, two teenage sons.   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   No current tobacco: 5 yr pack hx--quit 2006.   No alcohol.   No hx of prob with alc or drugs.   Social Drivers of Health   Financial Resource Strain: Medium Risk (08/15/2022)   Overall Financial Resource Strain (CARDIA)    Difficulty of Paying Living Expenses: Somewhat hard  Food Insecurity: No Food Insecurity (08/15/2022)   Hunger Vital Sign    Worried About Running Out of Food in the Last Year: Never true    Ran Out of Food in the Last Year: Never true  Transportation Needs: No Transportation Needs (08/15/2022)   PRAPARE - Administrator, Civil Service (Medical): No    Lack of Transportation (Non-Medical): No  Physical Activity: Sufficiently Active (08/15/2022)   Exercise Vital Sign    Days  of Exercise per Week: 5 days    Minutes of Exercise per Session: 60 min  Stress: No Stress Concern Present (08/15/2022)   [employer] of Occupational Health - Occupational Stress Questionnaire    Feeling of Stress : Not at all  Social Connections: Socially Isolated (08/15/2022)   Social Connection and Isolation Panel [NHANES]    Frequency of Communication with Friends and Family: Once a week    Frequency of Social Gatherings with Friends and Family: Once a week    Attends Religious Services: Never    Database administrator or Organizations: No    Attends Engineer, structural: Not on file    Marital Status: Living with partner  Intimate Partner Violence: Unknown (04/09/2021)   Received from [employer], [facility]   HITS    Physically Hurt: Not on file    Insult or Talk Down To: Not on file    Threaten Physical Harm: Not on file    Scream or Curse: Not on file   Past Surgical History:  Procedure Laterality Date   CARDIOVASCULAR STRESS TEST  01/2021   EF 66%, no inducible ischemia.   Family History  Problem Relation Age of Onset   Mental retardation Father    Depression Maternal Grandmother    Cancer Paternal Grandmother        Lung?    Current Outpatient Medications:    allopurinol  (ZYLOPRIM ) 100 MG tablet, Take 2 tablets by mouth daily. Please schedule an appointment for further refills., Disp: 180 tablet, Rfl: 0   Blood Glucose Monitoring Suppl (ONETOUCH VERIO FLEX SYSTEM) w/Device KIT, Test BS TID w/ meals Dx E11.69, Disp: 1 kit, Rfl: 0   Dulaglutide  (TRULICITY ) 1.5 MG/0.5ML SOPN, INJECT 1.5 MG SUBCUTANEOUSLY ONCE A WEEK, Disp: 6 mL, Rfl: 0   glucose blood (ONETOUCH VERIO) test strip, Test BS TID w/ meals Dx E11.69, Disp: 300 each, Rfl: 3   Lancets (ONETOUCH ULTRASOFT) lancets, Test BS TID w/ meals Dx E11.69, Disp: 300 each, Rfl: 3   losartan  (COZAAR ) 100 MG tablet, Take 1 tablet (100 mg total) by mouth daily for 90 doses., Disp: 90 tablet, Rfl: 0  Allergies   Allergen Reactions   Metformin  And Related Other (See Comments)    GI intolerance      ROS: Review of Systems Review of Systems  All other systems reviewed and are negative.  As per HPI   Physical exam    01/21/2023    1:45 PM 08/15/2022    1:38 PM 08/08/2022    3:50 PM  Vitals with BMI  Height 6\' 2"  6\' 2"  6\' 2"   Weight 286 lbs 300 lbs 302  lbs  BMI 36.7 38.5 38.76  Systolic 125 133 161  Diastolic 77 77 77  Pulse 100 83 93    Physical Exam Constitutional:      General: He is awake. He is not in acute distress.    Appearance: Normal appearance. He is well-developed, well-groomed and normal weight. He is not ill-appearing, toxic-appearing or diaphoretic.     Interventions: He is not intubated. HENT:     Head:     Salivary Glands: Right salivary gland is not diffusely enlarged or tender. Left salivary gland is not diffusely enlarged or tender.     Right Ear: No laceration, drainage, swelling or tenderness. No middle ear effusion. There is no impacted cerumen. No foreign body. No mastoid tenderness. No PE tube. No hemotympanum. Tympanic membrane is not injected, scarred, perforated, erythematous, retracted or bulging.     Left Ear: No laceration, drainage, swelling or tenderness.  No middle ear effusion. There is no impacted cerumen. No foreign body. No mastoid tenderness. No PE tube. No hemotympanum. Tympanic membrane is not injected, scarred, perforated, erythematous, retracted or bulging.     Nose: No nasal deformity, septal deviation, signs of injury, laceration, nasal tenderness, mucosal edema, congestion or rhinorrhea.     Right Sinus: No maxillary sinus tenderness or frontal sinus tenderness.     Left Sinus: No maxillary sinus tenderness or frontal sinus tenderness.  Eyes:     General: Lids are normal.     Extraocular Movements:     Right eye: Normal extraocular motion.     Left eye: Normal extraocular motion.     Conjunctiva/sclera:     Right eye: Right conjunctiva is not  injected. No chemosis, exudate or hemorrhage.    Left eye: Left conjunctiva is not injected. No chemosis, exudate or hemorrhage. Neck:     Thyroid : No thyroid  mass or thyromegaly.     Vascular: No carotid bruit.     Trachea: Trachea and phonation normal. No tracheal tenderness, tracheostomy or tracheal deviation.  Cardiovascular:     Rate and Rhythm: Normal rate and regular rhythm.     Pulses: Normal pulses.          Radial pulses are 2+ on the right side and 2+ on the left side.       Posterior tibial pulses are 2+ on the right side and 2+ on the left side.     Heart sounds: Normal heart sounds. No murmur heard.    No gallop.  Pulmonary:     Effort: Pulmonary effort is normal. No tachypnea, bradypnea, accessory muscle usage, prolonged expiration, respiratory distress or retractions. He is not intubated.     Breath sounds: Normal breath sounds. No stridor, decreased air movement or transmitted upper airway sounds. No decreased breath sounds, wheezing, rhonchi or rales.  Abdominal:     General: Abdomen is flat. Bowel sounds are normal. There is no distension or abdominal bruit. There are no signs of injury.     Palpations: Abdomen is soft. There is no shifting dullness, fluid wave, hepatomegaly, splenomegaly, mass or pulsatile mass.     Tenderness: There is no abdominal tenderness. There is no right CVA tenderness, left CVA tenderness, guarding or rebound.     Hernia: No hernia is present.  Musculoskeletal:     Cervical back: Full passive range of motion without pain, normal range of motion and neck supple. No edema, erythema, rigidity, torticollis or crepitus. No pain with movement. Normal range of motion.     Right  lower leg: No edema.     Left lower leg: No edema.  Lymphadenopathy:     Head:     Right side of head: No submental, submandibular, tonsillar, preauricular or posterior auricular adenopathy.     Left side of head: No submental, submandibular, tonsillar, preauricular or  posterior auricular adenopathy.     Cervical: No cervical adenopathy.     Right cervical: No superficial, deep or posterior cervical adenopathy.    Left cervical: No superficial, deep or posterior cervical adenopathy.  Skin:    General: Skin is warm.     Capillary Refill: Capillary refill takes less than 2 seconds.  Neurological:     General: No focal deficit present.     Mental Status: He is alert, oriented to person, place, and time and easily aroused. Mental status is at baseline.     GCS: GCS eye subscore is 4. GCS verbal subscore is 5. GCS motor subscore is 6.     Cranial Nerves: Cranial nerves 2-12 are intact. No cranial nerve deficit or facial asymmetry.     Motor: Motor function is intact. No weakness.     Gait: Gait is intact.  Psychiatric:        Attention and Perception: Attention and perception normal.        Mood and Affect: Mood and affect normal.        Speech: Speech normal.        Behavior: Behavior normal. Behavior is cooperative.        Thought Content: Thought content normal. Thought content does not include homicidal or suicidal ideation. Thought content does not include homicidal or suicidal plan.        Cognition and Memory: Cognition and memory normal.        Judgment: Judgment normal.        01/21/2023    1:46 PM 08/08/2022    3:56 PM 05/02/2022    2:12 PM  Depression screen PHQ 2/9  Decreased Interest 0 0 0  Down, Depressed, Hopeless 0 0 0  PHQ - 2 Score 0 0 0  Altered sleeping  0 2  Tired, decreased energy  0 0  Change in appetite  0 0  Feeling bad or failure about yourself   0 0  Trouble concentrating  0 0  Moving slowly or fidgety/restless  0 0  Suicidal thoughts  0 0  PHQ-9 Score  0 2  Difficult doing work/chores  Not difficult at all       01/21/2023    1:46 PM 08/08/2022    3:56 PM 05/02/2022    2:12 PM  GAD 7 : Generalized Anxiety Score  Nervous, Anxious, on Edge 0 0 0  Control/stop worrying 0 0 0  Worry too much - different things 0 0 0   Trouble relaxing 0 0 0  Restless 0 0 0  Easily annoyed or irritable 0 0 0  Afraid - awful might happen 0 0 0  Total GAD 7 Score 0 0 0  Anxiety Difficulty Not difficult at all Not difficult at all Not difficult at all  The 10-year ASCVD risk score (Arnett DK, et al., 2019) is: 6.6%   Values used to calculate the score:     Age: 59 years     Sex: Male     Is Non-Hispanic African American: No     Diabetic: Yes     Tobacco smoker: No     Systolic Blood Pressure: 125 mmHg  Is BP treated: Yes     HDL Cholesterol: 31 mg/dL     Total Cholesterol: 202 mg/dL  Assessment/ Plan: Kinnie Penton here for annual physical exam.  1. Routine general medical examination at a health care facility (Primary) Discussed with patient to continue healthy lifestyle choices, including diet (rich in fruits, vegetables, and lean proteins, and low in salt and simple carbohydrates) and exercise (at least 30 minutes of moderate physical activity daily). Limit beverages high is sugar. Recommended at least 80-100 oz of water daily.   2. Hyperlipidemia associated with type 2 diabetes mellitus (HCC) Reviewed prior ascvd risk score. Will discuss with patient need for statin based on results of labs below. Will communicate results to patient once available. Will await results to determine next steps. Ate breakfast at 0830 - Lipid panel  3. Type 2 diabetes mellitus with other specified complication, without long-term current use of insulin (HCC) Refills provided will start at lower dose given that patient has been out of medication for over 2 weeks. Labs as below. Will communicate results to patient once available. Will await results to determine next steps.  - Dulaglutide  (TRULICITY ) 0.75 MG/0.5ML SOAJ; Inject 0.75 mg into the skin once a week for 4 doses.  Dispense: 2 mL; Refill: 0 - CBC with Differential/Platelet - CMP14+EGFR - TSH - VITAMIN D  25 Hydroxy (Vit-D Deficiency, Fractures) - Bayer DCA Hb A1c Waived -  Dulaglutide  (TRULICITY ) 1.5 MG/0.5ML SOAJ; Inject 1.5 mg into the skin once a week for 4 doses.  Dispense: 2 mL; Refill: 0 - Dulaglutide  (TRULICITY ) 3 MG/0.5ML SOAJ; Inject 3 mg as directed once a week for 4 doses.  Dispense: 2 mL; Refill: 0  4. Hypertension associated with type 2 diabetes mellitus (HCC) Well controlled in office. Would like for patient to remain on ARB given T2DM. Will restart at a lower dose.  - losartan  (COZAAR ) 25 MG tablet; Take 1 tablet (25 mg total) by mouth daily.  Dispense: 90 tablet; Refill: 0 - CBC with Differential/Platelet - CMP14+EGFR  5. Idiopathic chronic gout without tophus, unspecified site Well controlled. Continue current regimen.  - allopurinol  (ZYLOPRIM ) 100 MG tablet; Take 2 tablets by mouth daily. Please schedule an appointment for further refills.  Dispense: 180 tablet; Refill: 0   Patient to follow up in 1 year for annual exam or sooner if needed.  The above assessment and management plan was discussed with the patient. The patient verbalized understanding of and has agreed to the management plan. Patient is aware to call the clinic if symptoms persist or worsen. Patient is aware when to return to the clinic for a follow-up visit. Patient educated on when it is appropriate to go to the emergency department.   Jacqualyn Mates, DNP-FNP [facility] [address] [place], [place] [address] [phone]

## 2023-01-21 NOTE — Patient Instructions (Signed)
Health Maintenance, Male Adopting a healthy lifestyle and getting preventive care are important in promoting health and wellness. Ask your health care provider about: The right schedule for you to have regular tests and exams. Things you can do on your own to prevent diseases and keep yourself healthy. What should I know about diet, weight, and exercise? Eat a healthy diet  Eat a diet that includes plenty of vegetables, fruits, low-fat dairy products, and lean protein. Do not eat a lot of foods that are high in solid fats, added sugars, or sodium. Maintain a healthy weight Body mass index (BMI) is a measurement that can be used to identify possible weight problems. It estimates body fat based on height and weight. Your health care provider can help determine your BMI and help you achieve or maintain a healthy weight. Get regular exercise Get regular exercise. This is one of the most important things you can do for your health. Most adults should: Exercise for at least 150 minutes each week. The exercise should increase your heart rate and make you sweat (moderate-intensity exercise). Do strengthening exercises at least twice a week. This is in addition to the moderate-intensity exercise. Spend less time sitting. Even light physical activity can be beneficial. Watch cholesterol and blood lipids Have your blood tested for lipids and cholesterol at 44 years of age, then have this test every 5 years. You may need to have your cholesterol levels checked more often if: Your lipid or cholesterol levels are high. You are older than 44 years of age. You are at high risk for heart disease. What should I know about cancer screening? Many types of cancers can be detected early and may often be prevented. Depending on your health history and family history, you may need to have cancer screening at various ages. This may include screening for: Colorectal cancer. Prostate cancer. Skin cancer. Lung  cancer. What should I know about heart disease, diabetes, and high blood pressure? Blood pressure and heart disease High blood pressure causes heart disease and increases the risk of stroke. This is more likely to develop in people who have high blood pressure readings or are overweight. Talk with your health care provider about your target blood pressure readings. Have your blood pressure checked: Every 3-5 years if you are 74-32 years of age. Every year if you are 39 years old or older. If you are between the ages of 109 and 77 and are a current or former smoker, ask your health care provider if you should have a one-time screening for abdominal aortic aneurysm (AAA). Diabetes Have regular diabetes screenings. This checks your fasting blood sugar level. Have the screening done: Once every three years after age 47 if you are at a normal weight and have a low risk for diabetes. More often and at a younger age if you are overweight or have a high risk for diabetes. What should I know about preventing infection? Hepatitis B If you have a higher risk for hepatitis B, you should be screened for this virus. Talk with your health care provider to find out if you are at risk for hepatitis B infection. Hepatitis C Blood testing is recommended for: Everyone born from [birth year] through 1966. Anyone with known risk factors for hepatitis C. Sexually transmitted infections (STIs) You should be screened each year for STIs, including gonorrhea and chlamydia, if: You are sexually active and are younger than 44 years of age. You are older than 44 years of age and your  health care provider tells you that you are at risk for this type of infection. Your sexual activity has changed since you were last screened, and you are at increased risk for chlamydia or gonorrhea. Ask your health care provider if you are at risk. Ask your health care provider about whether you are at high risk for HIV. Your health care provider  may recommend a prescription medicine to help prevent HIV infection. If you choose to take medicine to prevent HIV, you should first get tested for HIV. You should then be tested every 3 months for as long as you are taking the medicine. Follow these instructions at home: Alcohol use Do not drink alcohol if your health care provider tells you not to drink. If you drink alcohol: Limit how much you have to 0-2 drinks a day. Know how much alcohol is in your drink. In the [place] one drink equals one 12 oz bottle of beer (355 mL), one 5 oz glass of wine (148 mL), or one 1 oz glass of hard liquor (44 mL). Lifestyle Do not use any products that contain nicotine or tobacco. These products include cigarettes, chewing tobacco, and vaping devices, such as e-cigarettes. If you need help quitting, ask your health care provider. Do not use street drugs. Do not share needles. Ask your health care provider for help if you need support or information about quitting drugs. General instructions Schedule regular health, dental, and eye exams. Stay current with your vaccines. Tell your health care provider if: You often feel depressed. You have ever been abused or do not feel safe at home. Summary Adopting a healthy lifestyle and getting preventive care are important in promoting health and wellness. Follow your health care provider's instructions about healthy diet, exercising, and getting tested or screened for diseases. Follow your health care provider's instructions on monitoring your cholesterol and blood pressure. This information is not intended to replace advice given to you by your health care provider. Make sure you discuss any questions you have with your health care provider. Document Revised: 05/14/2020 Document Reviewed: 05/14/2020 Elsevier Patient Education  2024 Elsevier Inc.  Colonoscopy, Adult A colonoscopy is a procedure to look at the entire large intestine. This procedure is done using a  long, thin, flexible tube that has a camera on the end. You may have a colonoscopy: As a part of normal colorectal screening. If you have certain symptoms, such as: A low number of red blood cells in your blood (anemia). Diarrhea that does not go away. Pain in your abdomen. Blood in your stool. A colonoscopy can help screen for and diagnose medical problems, including: An abnormal growth of cells or tissue (tumor). Abnormal growths within the lining of your intestine (polyps). Inflammation. Areas of bleeding. Tell your health care provider about: Any allergies you have. All medicines you are taking, including vitamins, herbs, eye drops, creams, and over-the-counter medicines. Any problems you or family members have had with anesthetic medicines. Any bleeding problems you have. Any surgeries you have had. Any medical conditions you have. Any problems you have had with having bowel movements. Whether you are pregnant or may be pregnant. What are the risks? Generally, this is a safe procedure. However, problems may occur, including: Bleeding. Damage to your intestine. Allergic reactions to medicines given during the procedure. Infection. This is rare. What happens before the procedure? Eating and drinking restrictions Follow instructions from your health care provider about eating or drinking restrictions, which may include: A few days before  the procedure: Follow a low-fiber diet. Avoid nuts, seeds, dried fruit, raw fruits, and vegetables. 1-3 days before the procedure: Eat only gelatin dessert or ice pops. Drink only clear liquids, such as water, clear juice, clear broth or bouillon, black coffee or tea, or clear soft drinks or sports drinks. Avoid liquids that contain red or purple dye. The day of the procedure: Do not eat solid foods. You may continue to drink clear liquids until up to 2 hours before the procedure. Do not eat or drink anything starting 2 hours before the  procedure, or within the time period that your health care provider recommends. Bowel prep If you were prescribed a bowel prep to take by mouth (orally) to clean out your colon: Take it as told by your health care provider. Starting the day before your procedure, you will need to drink a large amount of liquid medicine. The liquid will cause you to have many bowel movements of loose stool until your stool becomes almost clear or light green. If your skin or the opening between the buttocks (anus) gets irritated from diarrhea, you may relieve the irritation using: Wipes with medicine in them, such as adult wet wipes with aloe and vitamin E. A product to soothe skin, such as petroleum jelly. If you vomit while drinking the bowel prep: Take a break for up to 60 minutes. Begin the bowel prep again. Call your health care provider if you keep vomiting or you cannot take the bowel prep without vomiting. To clean out your colon, you may also be given: Laxative medicines. These help you have a bowel movement. Instructions for enema use. An enema is liquid medicine injected into your rectum. Medicines Ask your health care provider about: Changing or stopping your regular medicines or supplements. This is especially important if you are taking iron supplements, diabetes medicines, or blood thinners. Taking medicines such as aspirin and ibuprofen. These medicines can thin your blood. Do not take these medicines unless your health care provider tells you to take them. Taking over-the-counter medicines, vitamins, herbs, and supplements. General instructions Ask your health care provider what steps will be taken to help prevent infection. These may include washing skin with a germ-killing soap. If you will be going home right after the procedure, plan to have a responsible adult: Take you home from the hospital or clinic. You will not be allowed to drive. Care for you for the time you are told. What happens  during the procedure?  An IV will be inserted into one of your veins. You will be given a medicine to make you fall asleep (general anesthetic). You will lie on your side with your knees bent. A lubricant will be put on the tube. Then the tube will be: Inserted into your anus. Gently eased through all parts of your large intestine. Air will be sent into your colon to keep it open. This may cause some pressure or cramping. Images will be taken with the camera and will appear on a screen. A small tissue sample may be removed to be looked at under a microscope (biopsy). The tissue may be sent to a lab for testing if any signs of problems are found. If small polyps are found, they may be removed and checked for cancer cells. When the procedure is finished, the tube will be removed. The procedure may vary among health care providers and hospitals. What happens after the procedure? Your blood pressure, heart rate, breathing rate, and blood oxygen level will  be monitored until you leave the hospital or clinic. You may have a small amount of blood in your stool. You may pass gas and have mild cramping or bloating in your abdomen. This is caused by the air that was used to open your colon during the exam. If you were given a sedative during the procedure, it can affect you for several hours. Do not drive or operate machinery until your health care provider says that it is safe. It is up to you to get the results of your procedure. Ask your health care provider, or the department that is doing the procedure, when your results will be ready. Summary A colonoscopy is a procedure to look at the entire large intestine. Follow instructions from your health care provider about eating and drinking before the procedure. If you were prescribed an oral bowel prep to clean out your colon, take it as told by your health care provider. During the colonoscopy, a flexible tube with a camera on its end is inserted  into the anus and then passed into all parts of the large intestine. This information is not intended to replace advice given to you by your health care provider. Make sure you discuss any questions you have with your health care provider. Document Revised: 02/04/2022 Document Reviewed: 08/15/2020 Elsevier Patient Education  2024 [employer].

## 2023-01-22 ENCOUNTER — Other Ambulatory Visit: Payer: Self-pay | Admitting: Family Medicine

## 2023-01-22 ENCOUNTER — Other Ambulatory Visit: Payer: No Typology Code available for payment source

## 2023-01-22 DIAGNOSIS — E1169 Type 2 diabetes mellitus with other specified complication: Secondary | ICD-10-CM

## 2023-01-22 LAB — CMP14+EGFR
ALT: 44 [IU]/L (ref 0–44)
AST: 25 [IU]/L (ref 0–40)
Albumin: 4.8 g/dL (ref 4.1–5.1)
Alkaline Phosphatase: 103 [IU]/L (ref 44–121)
BUN/Creatinine Ratio: 8 — ABNORMAL LOW (ref 9–20)
BUN: 11 mg/dL (ref 6–24)
Bilirubin Total: 0.8 mg/dL (ref 0.0–1.2)
CO2: 22 mmol/L (ref 20–29)
Calcium: 9.7 mg/dL (ref 8.7–10.2)
Chloride: 94 mmol/L — ABNORMAL LOW (ref 96–106)
Creatinine, Ser: 1.34 mg/dL — ABNORMAL HIGH (ref 0.76–1.27)
Globulin, Total: 2.6 g/dL (ref 1.5–4.5)
Glucose: 500 mg/dL (ref 70–99)
Potassium: 4.2 mmol/L (ref 3.5–5.2)
Sodium: 131 mmol/L — ABNORMAL LOW (ref 134–144)
Total Protein: 7.4 g/dL (ref 6.0–8.5)
eGFR: 67 mL/min/{1.73_m2} (ref >59)

## 2023-01-22 LAB — CBC WITH DIFFERENTIAL/PLATELET
Basophils Absolute: 0.1 10*3/uL (ref 0.0–0.2)
Basos: 1 %
EOS (ABSOLUTE): 0.1 10*3/uL (ref 0.0–0.4)
Eos: 1 %
Hematocrit: 46.6 % (ref 37.5–51.0)
Hemoglobin: 15.5 g/dL (ref 13.0–17.7)
Immature Grans (Abs): 0 10*3/uL (ref 0.0–0.1)
Immature Granulocytes: 0 %
Lymphocytes Absolute: 2.4 10*3/uL (ref 0.7–3.1)
Lymphs: 46 %
MCH: 28.7 pg (ref 26.6–33.0)
MCHC: 33.3 g/dL (ref 31.5–35.7)
MCV: 86 fL (ref 79–97)
Monocytes Absolute: 0.3 10*3/uL (ref 0.1–0.9)
Monocytes: 6 %
Neutrophils Absolute: 2.4 10*3/uL (ref 1.4–7.0)
Neutrophils: 46 %
Platelets: 249 10*3/uL (ref 150–450)
RBC: 5.4 x10E6/uL (ref 4.14–5.80)
RDW: 11.8 % (ref 11.6–15.4)
WBC: 5.2 10*3/uL (ref 3.4–10.8)

## 2023-01-22 LAB — URINALYSIS, ROUTINE W REFLEX MICROSCOPIC
Bilirubin, UA: NEGATIVE
Ketones, UA: NEGATIVE
Leukocytes,UA: NEGATIVE
Nitrite, UA: NEGATIVE
Protein,UA: NEGATIVE
Specific Gravity, UA: <1.005 — ABNORMAL LOW (ref 1.005–1.030)
Urobilinogen, Ur: 0.2 mg/dL (ref 0.2–1.0)
pH, UA: 6 (ref 5.0–7.5)

## 2023-01-22 LAB — LIPID PANEL
Chol/HDL Ratio: 6.2 ratio — ABNORMAL HIGH (ref 0.0–5.0)
Cholesterol, Total: 185 mg/dL (ref 100–199)
HDL: 30 mg/dL — ABNORMAL LOW (ref >39)
LDL Chol Calc (NIH): 120 mg/dL — ABNORMAL HIGH (ref 0–99)
Triglycerides: 199 mg/dL — ABNORMAL HIGH (ref 0–149)
VLDL Cholesterol Cal: 35 mg/dL (ref 5–40)

## 2023-01-22 LAB — MICROSCOPIC EXAMINATION
Bacteria, UA: NONE SEEN
Epithelial Cells (non renal): NONE SEEN /[HPF] (ref 0–10)
RBC, Urine: NONE SEEN /[HPF] (ref 0–2)
Renal Epithel, UA: NONE SEEN /[HPF]
WBC, UA: NONE SEEN /[HPF] (ref 0–5)
Yeast, UA: NONE SEEN

## 2023-01-22 LAB — TSH: TSH: 1.04 u[IU]/mL (ref 0.450–4.500)

## 2023-01-22 LAB — VITAMIN D 25 HYDROXY (VIT D DEFICIENCY, FRACTURES): Vit D, 25-Hydroxy: 15.8 ng/mL — ABNORMAL LOW (ref 30.0–100.0)

## 2023-01-22 LAB — GLUCOSE HEMOCUE WAIVED: Glu Hemocue Waived: 444 mg/dL — ABNORMAL HIGH (ref 70–99)

## 2023-01-22 MED ORDER — VITAMIN D (ERGOCALCIFEROL) 1.25 MG (50000 UNIT) PO CAPS: 50000.0000 [IU] | ORAL_CAPSULE | ORAL | 0 refills | Status: AC

## 2023-01-22 NOTE — Progress Notes (Signed)
Due to BG, would like for patient to come in today for labs. Would like for him to complete a fingerstick BG and urine to check for ketones. Please let me know when he is here, so I can speak with him.   Cholesterol is elevated. Diet encouraged - increase intake of fresh fruits and vegetables, increase intake of lean proteins. Bake, broil, or grill foods. Avoid fried, greasy, and fatty foods. Avoid fast foods. Increase intake of fiber-rich whole grains. Exercise encouraged - at least 150 minutes per week and advance as tolerated. Can also try red yeast rice and we will recheck in 3 months.  The 10-year ASCVD risk score (Arnett DK, et al., 2019) is: 5.9%  Vitamin D level is low. I have sent in a weekly supplement to take for the next 12 weeks. After that, take a daily OTC vitamin D supplement with 1000-2000 IU.

## 2023-01-22 NOTE — Addendum Note (Signed)
Addended by: Neale Burly on: 01/22/2023 08:03 AM   Modules accepted: Orders

## 2023-01-23 NOTE — Progress Notes (Signed)
See other result note.

## 2023-01-23 NOTE — Progress Notes (Signed)
Reviewed with patient in office. Patient to restart trulicity and glipizide. Raynelle Fanning may reach out to patient. If he has any issues with picking up medications, please call. Negative for ketones in urine.

## 2023-01-26 ENCOUNTER — Other Ambulatory Visit (HOSPITAL_COMMUNITY): Payer: Self-pay

## 2023-01-26 NOTE — Progress Notes (Signed)
01/27/2023 Name: Erik Davis MRN: [id number] DOB: Mar 24, 1980  Chief Complaint  Patient presents with   Diabetes   Hyperlipidemia    Erik Davis is a 43 y.o. year old male who presented for a telephone visit.  I connected with  Nicanor Alcon on 01/30/23 by telephone and verified that I am speaking with the correct person using two identifiers. I discussed the limitations of evaluation and management by telemedicine. The patient expressed understanding and agreed to proceed.  Patient was located in her home and PharmD in PCP office during this visit.   They were referred to the pharmacist by their PCP for assistance in managing diabetes.    Subjective:  Care Team: Primary Care Provider: Arrie Senate, FNP ; Next Scheduled Visit: not scheduled  Medication Access/Adherence  Current Pharmacy:  [facility] [address] [place] - [address] [place] [address] Phone: [phone] Fax: [phone]  [email or web address] - [facility] - [place], [place] - [address] [place] [address] Phone: [phone] Fax: [phone]   Patient reports affordability concerns with their medications: No  Patient reports access/transportation concerns to their pharmacy: No  Patient reports adherence concerns with their medications:  No     Diabetes:  Current medications: Trulicity 0.75 mg weekly, glipizide 5 mg daily in AM Medications tried in the past: metformin (GI intolerance)  Patient reports his diabetes was doing well until a lapse in insurance coverage last year when he was no longer able to afford Trulicity. He now has insurance again and was able to pick up Trulicity last week. He also restarted taking glipizide he had leftover at home due to high blood sugar. Reports that he was on glipizide when he started on Trulicity 0.75 mg previously and when he increased to the 1.5 mg dose he was able to  stop this as his diabetes was well controlled. Since restarting medications he reports hyperglycemia symptoms have significantly improved. Tolerating Trulicity well so far.  Current glucose readings: 200s  Using glucometer; testing 3-4 times daily  Patient denies hypoglycemic s/sx including dizziness, shakiness, sweating. Patient reports hyperglycemic symptoms including polyuria, polydipsia, polyphagia, nocturia, neuropathy, blurred visio, but these symptoms are improving.  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[employer], water [employer], 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   The 10-year ASCVD risk score (Arnett DK, et al., 2019) is: 5.9%   Values used to calculate the score:     Age: 48 years     Sex: Male     Is Non-Hispanic African American: No     Diabetic: Yes     Tobacco smoker: No     Systolic Blood Pressure: 125 mmHg     Is BP treated: Yes     HDL Cholesterol: 30 mg/dL     Total Cholesterol: 185 mg/dL    Objective:  Lab Results  Component Value Date  HGBA1C 11.5 (H) 01/21/2023    Lab Results  Component Value Date   CREATININE 1.34 (H) 01/21/2023   BUN 11 01/21/2023   NA 131 (L) 01/21/2023   K 4.2 01/21/2023   CL 94 (L) 01/21/2023   CO2 22 01/21/2023    Lab Results  Component Value Date   CHOL 185 01/21/2023   HDL 30 (L) 01/21/2023   LDLCALC 120 (H) 01/21/2023   TRIG 199 (H) 01/21/2023   CHOLHDL 6.2 (H) 01/21/2023    Medications Reviewed Today     Reviewed  by Vela Prose, RPH (Pharmacist) on 01/27/23 at 1611  Med List Status: <None>   Medication Order Taking? Sig Documenting Provider Last Dose Status Informant  allopurinol (ZYLOPRIM) 100 MG tablet [id number]  Take 2 tablets by mouth daily. Please schedule an appointment for further refills. Arrie Senate, FNP  Active   Blood Glucose Monitoring Suppl [facility] VERIO FLEX SYSTEM) w/Device KIT [id number]  Test BS TID w/ meals Dx E11.69 Arrie Senate, FNP  Active   Dulaglutide (TRULICITY) 0.75 MG/0.5ML [name] [id number] Yes Inject 0.75 mg into the skin once a week for 4 doses. Arrie Senate, FNP Taking Active   Dulaglutide (TRULICITY) 1.5 MG/0.5ML [name] [id number]  Inject 1.5 mg into the skin once a week for 4 doses. Arrie Senate, FNP  Active   Dulaglutide (TRULICITY) 3 MG/0.5ML [name] [id number]  Inject 3 mg as directed once a week for 4 doses. Arrie Senate, FNP  Active   glipiZIDE (GLIPIZIDE XL) 5 MG 24 hr tablet [id number] Yes Take 1 tablet (5 mg total) by mouth daily with breakfast. Arrie Senate, FNP  Active   glucose blood [facility] VERIO) test strip [id number]  Test BS TID w/ meals Dx E11.69 Arrie Senate, FNP  Active   Lancets [facility] ULTRASOFT) lancets [id number]  Test BS TID w/ meals Dx E11.69 Arrie Senate, FNP  Active   losartan (COZAAR) 25 MG tablet [id number]  Take 1 tablet (25 mg total) by mouth daily. Arrie Senate, FNP  Active   rosuvastatin (CRESTOR) 10 MG tablet [id number] Yes Take 1 tablet (10 mg total) by mouth daily. Arrie Senate, FNP  Active   Vitamin D, Ergocalciferol, (DRISDOL) 1.25 MG (50000 UNIT) CAPS capsule [id number]  Take 1 capsule (50,000 Units total) by mouth every 7 (seven) days for 12 doses. Arrie Senate, FNP  Active               Assessment/Plan:   Diabetes: - Currently uncontrolled based on last A1C 11.5% on 01/21/2023 (up from 5.8%) due to lack of access to  medications from lapse in insurance coverage. - Reviewed long term cardiovascular and renal outcomes of uncontrolled blood sugar - Reviewed goal A1c, goal fasting, and goal 2 hour post prandial glucose - Reviewed dietary modifications including staying well hydrated and limiting carbohydrates - Recommend to continue Trulicity 0.75 mg weekly x3 more weeks then increase to 1.5 mg weekly  - Recommend to continue glipizide for short term until he is able to titrate Trulicity. Patient restarted glipizide 5 mg IR he had on hand at home due to hyperglycemia and he is almost out of this supply. Will send in updated RX for glipizide XL 5 mg daily.  - Patient denies personal or family history of multiple endocrine neoplasia type 2, medullary thyroid cancer; personal history of pancreatitis or gallbladder disease. - Recommend to check FBG and PP BG at least once daily  Hyperlipidemia/ASCVD Risk Reduction: -  Currently uncontrolled based on LDL 120 mg/dl, above goal <16 mg/dl given diagnosis of X0RU with risk factors HLD, HTN, and obesity. He is indicated for moderate intensity statin therapy. Suspect improvement in lipids with improved glycemic control as well. - Reviewed long term complications of uncontrolled cholesterol - Reviewed lifestyle modifications including continuing to stay active and working on weight loss - Recommend to start rosuvastatin 10 mg daily.  - Patient with questions about red yeast rice. Counseled him not to take red yeast rice in addition to rosuvastatin due to interaction (red yeast rice contains small amounts of lovastatin). Rosuvastatin more effective for lowering cholesterol.    Follow Up Plan: PharmD on 02/17/2023  Jarrett Ables, PharmD PGY-1 Pharmacy Resident  Kieth Brightly, PharmD, BCACP, CPP Clinical Pharmacist, [facility]

## 2023-01-27 ENCOUNTER — Other Ambulatory Visit (INDEPENDENT_AMBULATORY_CARE_PROVIDER_SITE_OTHER): Payer: No Typology Code available for payment source | Admitting: Pharmacist

## 2023-01-27 DIAGNOSIS — E1169 Type 2 diabetes mellitus with other specified complication: Secondary | ICD-10-CM

## 2023-01-27 DIAGNOSIS — E785 Hyperlipidemia, unspecified: Secondary | ICD-10-CM

## 2023-01-27 DIAGNOSIS — Z7985 Long-term (current) use of injectable non-insulin antidiabetic drugs: Secondary | ICD-10-CM

## 2023-01-27 DIAGNOSIS — Z7984 Long term (current) use of oral hypoglycemic drugs: Secondary | ICD-10-CM

## 2023-01-27 MED ORDER — ROSUVASTATIN CALCIUM 10 MG PO TABS: 10.0000 mg | ORAL_TABLET | Freq: Every day | ORAL | 3 refills | Status: DC

## 2023-01-27 MED ORDER — GLIPIZIDE ER 5 MG PO TB24: 5.0000 mg | ORAL_TABLET | Freq: Every day | ORAL | 0 refills | Status: DC

## 2023-02-03 ENCOUNTER — Ambulatory Visit: Payer: Self-pay | Admitting: Family Medicine

## 2023-02-03 NOTE — Telephone Encounter (Signed)
Chief Complaint: Vision changes Symptoms: Double,triple vision, blurred vision Frequency: >1 week Pertinent Negatives: Patient denies confusion, weakness in arms and legs Disposition: [] ED /[] Urgent Care (no appt availability in office) / [x] Appointment(In office/virtual)/ []   [facility]/ [] Home Care/ [] Refused Recommended Disposition /[] [facility]/ []  Follow-up with PCP Additional Notes: Patient's wife called, stating patient is at work and not with her, but patient has been refusing to call to report symptoms of vision changes. Patient's wife states that patient was put on Trulicity  last year for high blood sugar (averaging in 400's), patient came off of Trulicity for 2.5 months due to insurance changes, and is now back on it. Patient is also on Glipizide. Wife states that last time patient was on Glipizide, he had blurry vision. Since restarting Trulicity, patient has been reporting double and triple vision to his wife, stating he is unable to see to do anything and cannot type texts. Patient's wife denies any report of patient feeling dizzy, confused, weakness in arms or legs. Patient's wife states patient intermittently has headaches. In-office appointment scheduled for evaluation for tomorrow.    Copied from CRM [id number]. Topic: Clinical - Red Word Triage >> Feb 03, 2023  3:17 PM Gaetano Hawthorne wrote: [facility] that prompted transfer to Nurse Triage: Patient has been having some trouble with his vision for the past 9 days - double/triple vision - hard to even type or text on his phone. Reason for Disposition  [1] Brief (now gone) blurred vision AND [2] unexplained    Still existing symptoms related to medication  Answer Assessment - Initial Assessment Questions 1. DESCRIPTION: "How has your vision changed?" (e.g., complete vision loss, blurred vision, double vision, floaters, etc.)     Seeing double and triple, blurred vision, seeing spots 2. LOCATION: "One or both eyes?" If  one, ask: "Which eye?"     Both 3. SEVERITY: "Can you see anything?" If Yes, ask: "What can you see?" (e.g., fine print)     Can't really see much, when he texts he cannot see texts 4. ONSET: "When did this begin?" "Did it start suddenly or has this been gradual?"     At least the past week  5. PATTERN: "Does this come and go, or has it been constant since it started?"     Constant 6. PAIN: "Is there any pain in your eye(s)?"  (Scale 1-10; or mild, moderate, severe)   - NONE (0): No pain.   - MILD (1-3): Doesn't interfere with normal activities.   - MODERATE (4-7): Interferes with normal activities or awakens from sleep.    - SEVERE (8-10): Excruciating pain, unable to do any normal activities.     Patient hasn't stated pain 7. CONTACTS-GLASSES: "Do you wear contacts or glasses?"     Both 8. CAUSE: "What do you think is causing this visual problem?"     Believed to be Trulicity or Glipizide  9. OTHER SYMPTOMS: "Do you have any other symptoms?" (e.g., confusion, headache, arm or leg weakness, speech problems)     Headaches intermittently  Protocols used: Vision Loss or Change-A-AH

## 2023-02-04 ENCOUNTER — Other Ambulatory Visit (HOSPITAL_COMMUNITY): Payer: Self-pay

## 2023-02-04 ENCOUNTER — Encounter: Payer: Self-pay | Admitting: Family Medicine

## 2023-02-04 ENCOUNTER — Telehealth: Payer: Self-pay

## 2023-02-04 ENCOUNTER — Ambulatory Visit (INDEPENDENT_AMBULATORY_CARE_PROVIDER_SITE_OTHER): Payer: No Typology Code available for payment source | Admitting: Family Medicine

## 2023-02-04 VITALS — BP 118/74 | HR 88 | Temp 98.9°F | Ht 74.0 in | Wt 286.0 lb

## 2023-02-04 DIAGNOSIS — H539 Unspecified visual disturbance: Secondary | ICD-10-CM

## 2023-02-04 DIAGNOSIS — H538 Other visual disturbances: Secondary | ICD-10-CM

## 2023-02-04 NOTE — Telephone Encounter (Signed)
Pharmacy Patient Advocate Encounter   Received notification from CoverMyMeds that prior authorization for Trulicity 0.75MG /0.5ML auto-injectors is required/requested.   Insurance verification completed.   The patient is insured through  [facility] [employer]  .   Per test claim: The current 28 day co-pay is, $15.  No PA needed at this time. This test claim was processed through [facility]- copay amounts may vary at other pharmacies due to pharmacy/plan contracts, or as the patient moves through the different stages of their insurance plan.

## 2023-02-04 NOTE — Progress Notes (Signed)
Subjective:  Patient ID: Erik Davis, male    DOB: [birth year], 43 y.o.   MRN: [id number]  Patient Care Team: Ellamae Sia Aleen Campi, FNP as PCP - General (Family Medicine)   Chief Complaint:  Eye Problem (X 1 week/Concern drug interaction, patient believes it started after taking glipizide extended)   HPI: Erik Davis is a 43 y.o. male presenting on 02/04/2023 for Eye Problem (X 1 week/Concern drug interaction, patient believes it started after taking glipizide extended)  Eye Problem   States that he "cannot see". Reports that he woke up last Thursday with blurry vision and it has not gotten better. Is getting worse. States that he cannot read papers anymore, states that it looks like he is "under water". Does not focus. Endorses headache at first, but this has improved. Recently restarted on Trulicity and glipizide. States that he used to have blurry vision when he was on dual therapy due to low BG. However, he is checking his BP and it is 180-185. Highest yesterday was 210. Denies floaters. Denies eye movement pain.   Relevant past medical, surgical, family, and social history reviewed and updated as indicated.  Allergies and medications reviewed and updated. Data reviewed: Chart in Epic.   Past Medical History:  Diagnosis Date   Adult ADHD    managed by psychiatrist up until 09/2016   Bipolar affective disorder (HCC)    managed by psych up until 09/2016   Borderline hyperlipidemia 04/2017   Diabetes mellitus (HCC) 2018   2018 HbA1c 5.9%.  Random gluc >200 and Hba1c 6.4% 02/2020.   Essential hypertension 2018   Fatty liver 2010   Noted on CT abd   Gout    two episodes1 yr apart--big toe.  Never been on preventative meds.   Nonspecific mesenteric adenitis 2010   Hx of   Obesity, Class II, BMI 35-39.9     Past Surgical History:  Procedure Laterality Date   CARDIOVASCULAR STRESS TEST  01/2021   EF 66%, no inducible ischemia.    Social History   Socioeconomic History    Marital status: Married    Spouse name: Not on file   Number of children: Not on file   Years of education: Not on file   Highest education level: 12th grade  Occupational History   Not on file  Tobacco Use   Smoking status: Former    Current packs/day: 0.00    Average packs/day: 0.5 packs/day for 7.0 years (3.5 ttl pk-yrs)    Types: Cigarettes    Start date: 01/06/1997    Quit date: 01/07/2004    Years since quitting: 19.0   Smokeless tobacco: Never  Vaping Use   Vaping status: Never Used  Substance and Sexual Activity   Alcohol use: Yes    Comment: rare - 2-3 beers a year   Drug use: No   Sexual activity: Not on file  Other Topics Concern   Not on file  Social History Narrative   Single, two teenage sons.   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   No current tobacco: 5 yr pack hx--quit 2006.   No alcohol.   No hx of prob with alc or drugs.   Social Drivers of Health   Financial Resource Strain: Medium Risk (08/15/2022)   Overall Financial Resource Strain (CARDIA)    Difficulty of Paying Living Expenses: Somewhat hard  Food Insecurity: No Food Insecurity (08/15/2022)   Hunger Vital Sign    Worried About  Running Out of Food in the Last Year: Never true    Ran Out of Food in the Last Year: Never true  Transportation Needs: No Transportation Needs (08/15/2022)   PRAPARE - Administrator, Civil Service (Medical): No    Lack of Transportation (Non-Medical): No  Physical Activity: Sufficiently Active (08/15/2022)   Exercise Vital Sign    Days of Exercise per Week: 5 days    Minutes of Exercise per Session: 60 min  Stress: No Stress Concern Present (08/15/2022)   [employer] of Occupational Health - Occupational Stress Questionnaire    Feeling of Stress : Not at all  Social Connections: Socially Isolated (08/15/2022)   Social Connection and Isolation Panel [NHANES]    Frequency of Communication with Friends and Family: Once a week    Frequency of  Social Gatherings with Friends and Family: Once a week    Attends Religious Services: Never    Database administrator or Organizations: No    Attends Engineer, structural: Not on file    Marital Status: Living with partner  Intimate Partner Violence: Unknown (04/09/2021)   Received from [employer]   HITS    Physically Hurt: Not on file    Insult or Talk Down To: Not on file    Threaten Physical Harm: Not on file    Scream or Curse: Not on file    Outpatient Encounter Medications as of 02/04/2023  Medication Sig   allopurinol (ZYLOPRIM) 100 MG tablet Take 2 tablets by mouth daily. Please schedule an appointment for further refills.   Blood Glucose Monitoring Suppl (ONETOUCH VERIO FLEX SYSTEM) w/Device KIT Test BS TID w/ meals Dx E11.69   Dulaglutide (TRULICITY) 0.75 MG/0.5ML SOAJ Inject 0.75 mg into the skin once a week for 4 doses.   [START ON 02/12/2023] Dulaglutide (TRULICITY) 1.5 MG/0.5ML SOAJ Inject 1.5 mg into the skin once a week for 4 doses.   [START ON 03/06/2023] Dulaglutide (TRULICITY) 3 MG/0.5ML SOAJ Inject 3 mg as directed once a week for 4 doses.   glipiZIDE (GLIPIZIDE XL) 5 MG 24 hr tablet Take 1 tablet (5 mg total) by mouth daily with breakfast.   glucose blood (ONETOUCH VERIO) test strip Test BS TID w/ meals Dx E11.69   Lancets (ONETOUCH ULTRASOFT) lancets Test BS TID w/ meals Dx E11.69   losartan (COZAAR) 25 MG tablet Take 1 tablet (25 mg total) by mouth daily.   rosuvastatin (CRESTOR) 10 MG tablet Take 1 tablet (10 mg total) by mouth daily.   Vitamin D, Ergocalciferol, (DRISDOL) 1.25 MG (50000 UNIT) CAPS capsule Take 1 capsule (50,000 Units total) by mouth every 7 (seven) days for 12 doses.   No facility-administered encounter medications on file as of 02/04/2023.    Allergies  Allergen Reactions   Metformin And Related Other (See Comments)    GI intolerance     Review of Systems As per HPI  Objective:  BP 118/74   Pulse 88   Temp 98.9  F (37.2 C)   Ht 6\' 2"  (1.88 m)   Wt 286 lb (129.7 kg)   SpO2 97%   BMI 36.72 kg/m    Wt Readings from Last 3 Encounters:  02/04/23 286 lb (129.7 kg)  01/21/23 286 lb (129.7 kg)  08/15/22 300 lb (136.1 kg)   Physical Exam Constitutional:      General: He is awake. He is not in acute distress.    Appearance: Normal appearance. He is well-developed  and well-groomed. He is obese. He is not ill-appearing, toxic-appearing or diaphoretic.  Eyes:     General: Lids are normal.        Right eye: No foreign body, discharge or hordeolum.        Left eye: No foreign body, discharge or hordeolum.     Extraocular Movements: Extraocular movements intact.     Right eye: Normal extraocular motion and no nystagmus.     Left eye: Normal extraocular motion and no nystagmus.     Conjunctiva/sclera:     Right eye: Right conjunctiva is not injected. No chemosis, exudate or hemorrhage.    Left eye: Left conjunctiva is not injected. No chemosis, exudate or hemorrhage.    Pupils: Pupils are equal, round, and reactive to light. Pupils are equal.     Right eye: Pupil is round, reactive and not sluggish.     Left eye: Pupil is round, reactive and not sluggish.     Funduscopic exam:    Right eye: Red reflex present.        Left eye: Red reflex present. Cardiovascular:     Rate and Rhythm: Normal rate and regular rhythm.     Pulses: Normal pulses.          Radial pulses are 2+ on the right side and 2+ on the left side.       Posterior tibial pulses are 2+ on the right side and 2+ on the left side.     Heart sounds: Normal heart sounds. No murmur heard.    No gallop.  Pulmonary:     Effort: Pulmonary effort is normal. No respiratory distress.     Breath sounds: Normal breath sounds. No stridor. No wheezing, rhonchi or rales.  Musculoskeletal:     Cervical back: Full passive range of motion without pain and neck supple.     Right lower leg: No edema.     Left lower leg: No edema.  Skin:    General:  Skin is warm.     Capillary Refill: Capillary refill takes less than 2 seconds.  Neurological:     General: No focal deficit present.     Mental Status: He is alert, oriented to person, place, and time and easily aroused. Mental status is at baseline.     GCS: GCS eye subscore is 4. GCS verbal subscore is 5. GCS motor subscore is 6.     Motor: No weakness.  Psychiatric:        Attention and Perception: Attention and perception normal.        Mood and Affect: Mood and affect normal.        Speech: Speech normal.        Behavior: Behavior normal. Behavior is cooperative.        Thought Content: Thought content normal. Thought content does not include homicidal or suicidal ideation. Thought content does not include homicidal or suicidal plan.        Cognition and Memory: Cognition and memory normal.        Judgment: Judgment normal.      Results for orders placed or performed in visit on 01/22/23  Microscopic Examination   Collection Time: 01/22/23 12:00 AM  Result Value Ref Range   WBC, UA None seen 0 - 5 /hpf   RBC, Urine None seen 0 - 2 /hpf   Epithelial Cells (non renal) None seen 0 - 10 /hpf   Renal Epithel, UA None seen None seen /hpf  Bacteria, UA None seen None seen/Few   Yeast, UA None seen None seen  Urinalysis, Routine w reflex microscopic   Collection Time: 01/22/23 12:00 AM  Result Value Ref Range   Specific Gravity, UA <1.005 (L) 1.005 - 1.030   pH, UA 6.0 5.0 - 7.5   Color, UA Yellow Yellow   Appearance Ur Clear Clear   Leukocytes,UA Negative Negative   Protein,UA Negative Negative/Trace   Glucose, UA 3+ (A) Negative   Ketones, UA Negative Negative   RBC, UA Trace (A) Negative   Bilirubin, UA Negative Negative   Urobilinogen, Ur 0.2 0.2 - 1.0 mg/dL   Nitrite, UA Negative Negative   Microscopic Examination See below:        01/21/2023    1:46 PM 08/08/2022    3:56 PM 05/02/2022    2:12 PM 08/12/2018    3:24 PM 10/28/2017    8:40 AM  Depression screen PHQ  2/9  Decreased Interest 0 0 0 0 0  Down, Depressed, Hopeless 0 0 0 0 0  PHQ - 2 Score 0 0 0 0 0  Altered sleeping  0 2  0  Tired, decreased energy  0 0  0  Change in appetite  0 0  0  Feeling bad or failure about yourself   0 0  0  Trouble concentrating  0 0  0  Moving slowly or fidgety/restless  0 0  0  Suicidal thoughts  0 0  0  PHQ-9 Score  0 2  0  Difficult doing work/chores  Not difficult at all   Not difficult at all       01/21/2023    1:46 PM 08/08/2022    3:56 PM 05/02/2022    2:12 PM  GAD 7 : Generalized Anxiety Score  Nervous, Anxious, on Edge 0 0 0  Control/stop worrying 0 0 0  Worry too much - different things 0 0 0  Trouble relaxing 0 0 0  Restless 0 0 0  Easily annoyed or irritable 0 0 0  Afraid - awful might happen 0 0 0  Total GAD 7 Score 0 0 0  Anxiety Difficulty Not difficult at all Not difficult at all Not difficult at all   Pertinent labs & imaging results that were available during my care of the patient were reviewed by me and considered in my medical decision making.  Assessment & Plan:  Bayley "Barbara Cower" was seen today for eye problem.  Diagnoses and all orders for this visit:  Vision abnormalities Urgent referral placed to ophthalmology. Encouraged patient to go to optometry today for vision exam.  -     Ambulatory referral to Ophthalmology  Blurry vision, bilateral -     Ambulatory referral to Ophthalmology     Continue all other maintenance medications.  Follow up plan: Return in about 10 weeks (around 04/15/2023).   Continue healthy lifestyle choices, including diet (rich in fruits, vegetables, and lean proteins, and low in salt and simple carbohydrates) and exercise (at least 30 minutes of moderate physical activity daily).  Written and verbal instructions provided   The above assessment and management plan was discussed with the patient. The patient verbalized understanding of and has agreed to the management plan. Patient is aware to  call the clinic if they develop any new symptoms or if symptoms persist or worsen. Patient is aware when to return to the clinic for a follow-up visit. Patient educated on when it is appropriate to go to the  emergency department.   Neale Burly, DNP-FNP [facility] [address] [place], [place] [address] [phone]

## 2023-02-13 ENCOUNTER — Other Ambulatory Visit: Payer: Self-pay | Admitting: Family Medicine

## 2023-02-13 DIAGNOSIS — E1169 Type 2 diabetes mellitus with other specified complication: Secondary | ICD-10-CM

## 2023-02-16 ENCOUNTER — Other Ambulatory Visit: Payer: Self-pay | Admitting: Family Medicine

## 2023-02-16 DIAGNOSIS — E1169 Type 2 diabetes mellitus with other specified complication: Secondary | ICD-10-CM

## 2023-02-17 ENCOUNTER — Other Ambulatory Visit (HOSPITAL_COMMUNITY): Payer: Self-pay

## 2023-02-17 ENCOUNTER — Other Ambulatory Visit (INDEPENDENT_AMBULATORY_CARE_PROVIDER_SITE_OTHER): Payer: No Typology Code available for payment source

## 2023-02-17 ENCOUNTER — Telehealth: Payer: Self-pay

## 2023-02-17 DIAGNOSIS — E1169 Type 2 diabetes mellitus with other specified complication: Secondary | ICD-10-CM

## 2023-02-17 DIAGNOSIS — Z7985 Long-term (current) use of injectable non-insulin antidiabetic drugs: Secondary | ICD-10-CM

## 2023-02-17 DIAGNOSIS — Z7984 Long term (current) use of oral hypoglycemic drugs: Secondary | ICD-10-CM

## 2023-02-17 NOTE — Telephone Encounter (Signed)
Copied from CRM [id number]. Topic: General - Other >> Feb 17, 2023  3:19 PM Alcus Dad H wrote: Reason for CRM: Patient had phone visit for patient outreach with the pharmacist and his called failed, he would like a call back to finish the visit

## 2023-02-17 NOTE — Progress Notes (Unsigned)
02/17/2023 Name: Erik Davis MRN: [id number] DOB: 12-01-1980  No chief complaint on file.   Erik Davis is a 43 y.o. year old male who presented for a telephone visit.   They were referred to the pharmacist by their PCP for assistance in managing diabetes.    Subjective:  Care Team: Primary Care Provider: Arrie Senate, FNP ; Next Scheduled Visit: not yet scheduled  Medication Access/Adherence  Current Pharmacy:  [facility] [address] [place] - [address] [place] [address] Phone: [phone] Fax: [phone]  [email or web address] - [place], [place] - [address] [place] [address] Phone: [phone] Fax: [phone]   Patient reports affordability concerns with their medications: No  Patient reports access/transportation concerns to their pharmacy: No  Patient reports adherence concerns with their medications:  No     Diabetes:  Current medications: Trulicity 0.75 mg weekly, glipizide XL 5 mg daily Medications tried in the past: metformin (GI intolerance)  Patient reports he is tolerating Trulicity well. Endorses mild diarrhea when he eats something he knows he shouldn't but otherwise denies constipation and n/v. Next Trulicity dose is due on Thursday. States he went to pick up the 1.5 mg dose, but was told this needed a prior authorization. Patient notes that his vision issues have resolved. Saw the eye doctor and was told he did not have any evidence of eye damage from his diabetes. Also was given glasses which greatly improved his vision and he feels he is back to normal now.  Using glucometer; testing 4 times daily AM FBG: ~125 Before dinner: 80-100 Reports BG is always 80-130 when he checks  Patient denies hypoglycemic s/sx including dizziness, shakiness, sweating. Patient denies hyperglycemic symptoms including polyuria, polydipsia, polyphagia,  nocturia, neuropathy, blurred vision.  Current meal patterns: 3 meals/day - Breakfast: breakfast burrito with egg and sausage - Lunch: sandwich - Supper: chicken, broccoli, green beans, mashed potatoes - Snacks: usually does not snack - Drinks: water, sweet tea (drinks 32 oz cup at work and when he gets home, it is sweet, but they make it at home and put only a small amount of sugar in it)   Hyperlipidemia/ASCVD Risk Reduction  Current lipid lowering medications: rosuvastatin 10 mg daily  Patient reports adherence and is tolerating rosuvastatin well.  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   The 10-year ASCVD risk score (Arnett DK, et al., 2019) is: 5.3%   Values used to calculate the score:     Age: 46 years     Sex: Male     Is Non-Hispanic African American: No     Diabetic: Yes     Tobacco smoker: No     Systolic Blood Pressure: 118 mmHg     Is BP treated: Yes     HDL Cholesterol: 30 mg/dL     Total Cholesterol: 185 mg/dL   Objective:  Lab Results  Component Value Date   HGBA1C 11.5 (H) 01/21/2023    Lab Results  Component Value Date   CREATININE 1.34 (H) 01/21/2023   BUN 11 01/21/2023   NA 131 (L) 01/21/2023   K 4.2 01/21/2023   CL 94 (L) 01/21/2023   CO2 22 01/21/2023    Lab Results  Component Value Date   CHOL 185  01/21/2023   HDL 30 (L) 01/21/2023   LDLCALC 120 (H) 01/21/2023   TRIG 199 (H) 01/21/2023   CHOLHDL 6.2 (H) 01/21/2023    Medications Reviewed Today     Reviewed by Erik Davis, RPH (Pharmacist) on 02/17/23 at 1614  Med List Status: <None>   Medication Order Taking? Sig Documenting Provider Last Dose Status Informant  allopurinol (ZYLOPRIM) 100 MG tablet [id number]  Take 2 tablets by mouth daily. Please schedule an appointment for further refills. Arrie Senate, FNP  Active   Blood Glucose Monitoring  Suppl [facility] VERIO FLEX SYSTEM) w/Device KIT [id number]  Test BS TID w/ meals Dx E11.69 Arrie Senate, FNP  Active   Dulaglutide (TRULICITY) 1.5 MG/0.5ML [name] [id number]  Inject 1.5 mg into the skin once a week for 4 doses. Arrie Senate, FNP  Active   Dulaglutide (TRULICITY) 3 MG/0.5ML [name] [id number]  Inject 3 mg as directed once a week for 4 doses. Arrie Senate, FNP  Active   glipiZIDE (GLIPIZIDE XL) 5 MG 24 hr tablet [id number] Yes Take 1 tablet (5 mg total) by mouth daily with breakfast. Erik Sia Aleen Campi, FNP Taking Active   glucose blood [facility] VERIO) test strip [id number]  Test BS TID w/ meals Dx E11.69 Arrie Senate, FNP  Active   Lancets [facility] ULTRASOFT) lancets [id number]  Test BS TID w/ meals Dx E11.69 Arrie Senate, FNP  Active   losartan (COZAAR) 25 MG tablet [id number]  Take 1 tablet (25 mg total) by mouth daily. Arrie Senate, FNP  Active   rosuvastatin (CRESTOR) 10 MG tablet [id number] Yes Take 1 tablet (10 mg total) by mouth daily. Arrie Senate, FNP Taking Active   Vitamin D, Ergocalciferol, (DRISDOL) 1.25 MG (50000 UNIT) CAPS capsule [id number]  Take 1 capsule (50,000 Units total) by mouth every 7 (seven) days for 12 doses. Arrie Senate, FNP  Active               Assessment/Plan:   Diabetes: - Currently uncontrolled based on last A1c 11.5% on 01/21/23, but patient reported blood sugars are much improved and now within goal. Given BG readings are at goal and he is tolerating GLP-1 well, will increase Trulicity and discontinue glipizide when starting increased Trulicity dose. - Reviewed long term cardiovascular and renal outcomes of uncontrolled blood sugar - Reviewed goal A1c, goal fasting, and goal 2 hour post prandial glucose - Recommend to increase to Trulicity to 1.5 mg weekly. Attempted to complete PA as patient requested, but RX did not need PA. [facility] and they  were able fill increased dose with no issues. - Recommend to stop glipizide when increasing to Trulicity 1.5 mg - Patient denies personal or family history of multiple endocrine neoplasia type 2, medullary thyroid cancer; personal history of pancreatitis or gallbladder disease. - Recommended to continue following with ophthalmology for yearly diabetic eye exams - Recommend to check fasting blood glucose daily    Hyperlipidemia/ASCVD Risk Reduction: - Currently uncontrolled based on LDL 120 mg/dl, above goal <30 mg/dl given diagnosis of Z6WF with risk factors HLD, HTN, and obesity. He is indicated for moderate intensity statin therapy. Since last lipid panel has started rosuvastatin and is tolerating this well. Suspect improvement in lipids with improved glycemic control as well.  - Reviewed long term complications of uncontrolled cholesterol - Recommend to continue rosuvastatin 10 mg daily   Follow Up Plan: PharmD on 03/17/23  Jarrett Ables, PharmD PGY-1 Pharmacy Resident

## 2023-02-20 NOTE — Telephone Encounter (Signed)
Patient was called back Visit was documented in PharmD note

## 2023-02-22 ENCOUNTER — Other Ambulatory Visit: Payer: Self-pay | Admitting: Family Medicine

## 2023-02-22 DIAGNOSIS — E1169 Type 2 diabetes mellitus with other specified complication: Secondary | ICD-10-CM

## 2023-03-12 ENCOUNTER — Other Ambulatory Visit: Payer: Self-pay | Admitting: Family Medicine

## 2023-03-12 DIAGNOSIS — E1169 Type 2 diabetes mellitus with other specified complication: Secondary | ICD-10-CM

## 2023-03-12 MED ORDER — TRULICITY 3 MG/0.5ML ~~LOC~~ SOAJ: 3.0000 mg | SUBCUTANEOUS | 0 refills | Status: AC

## 2023-03-12 NOTE — Progress Notes (Signed)
 Patient has completed 1.5 mg. He did not receive 3 mg and would like to increase dose. Sent in new Rx for pharmacy.

## 2023-03-17 ENCOUNTER — Other Ambulatory Visit (INDEPENDENT_AMBULATORY_CARE_PROVIDER_SITE_OTHER): Payer: No Typology Code available for payment source

## 2023-03-17 DIAGNOSIS — Z7985 Long-term (current) use of injectable non-insulin antidiabetic drugs: Secondary | ICD-10-CM

## 2023-03-17 DIAGNOSIS — E1169 Type 2 diabetes mellitus with other specified complication: Secondary | ICD-10-CM

## 2023-03-17 DIAGNOSIS — E785 Hyperlipidemia, unspecified: Secondary | ICD-10-CM

## 2023-03-17 NOTE — Progress Notes (Unsigned)
 03/17/2023 Name: Erik Davis MRN: [id number] DOB: 1980/11/20  Chief Complaint  Patient presents with   Diabetes   Hyperlipidemia    Erik Davis is a 43 y.o. year old male who presented for a telephone visit.   They were referred to the pharmacist by their PCP for assistance in managing diabetes.   Subjective:  Care Team: Primary Care Provider: Arrie Senate, FNP ; Next Scheduled Visit: not yet scheduled  Medication Access/Adherence  Current Pharmacy:  [facility] [address], [place] - [address] [place] [address] Phone: [phone] Fax: [phone]  [email or web address] - [place], [place] - [address] [place] [address] Phone: [phone] Fax: [phone]   Patient reports affordability concerns with their medications: No  Patient reports access/transportation concerns to their pharmacy: No  Patient reports adherence concerns with their medications:  No     Diabetes:  Current medications: Trulicity 1.5 mg weekly  Patient reports he is tolerating Trulicity well and is due for a refill. He appropriately stopped glipizide when increasing the dose of Trulicity last month. Also notes his vision has improved and he does not need glasses anymore. Was able to return them to eye doctor who told him the transient vision issues may have resolved now that his blood sugar is under better control.  Using glucometer; testing 3 times daily Reports highest reading is 135 AM FBG: 110-120 Before lunch: 90s Before dinner: 115-120   Patient denies hypoglycemic s/sx including dizziness, shakiness, sweating. Patient denies hyperglycemic symptoms including polyuria, polydipsia, polyphagia, nocturia, neuropathy, blurred vision.  Current meal patterns:  Current meal patterns: 3 meals/day - Breakfast: breakfast burrito with egg and sausage - Lunch: sandwich - Supper:  chicken, broccoli, green beans, mashed potatoes - Snacks: usually does not snack - Drinks: water, sweet tea (drinks 32 oz cup at work and when he gets home, it is sweet, but they make it at home and put only a small amount of sugar in it)   Hyperlipidemia/ASCVD Risk Reduction  Current lipid lowering medications: rosuvastatin 10 mg daily  ASCVD History: none Risk Factors: T2DM, HLD, HTN, obesity  Current physical activity: has a physical job working in Scientist, water quality, 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   The 10-year ASCVD risk score (Arnett DK, et al., 2019) is: 5.9%   Values used to calculate the score:     Age: 62 years     Sex: Male     Is Non-Hispanic African American: No     Diabetic: Yes     Tobacco smoker: No     Systolic Blood Pressure: 118 mmHg     Is BP treated: Yes     HDL Cholesterol: 30 mg/dL     Total Cholesterol: 185 mg/dL    Objective:  Lab Results  Component Value Date   HGBA1C 11.5 (H) 01/21/2023    Lab Results  Component Value Date   CREATININE 1.34 (H) 01/21/2023   BUN 11 01/21/2023   NA 131 (L) 01/21/2023   K 4.2 01/21/2023   CL 94 (L) 01/21/2023   CO2 22 01/21/2023    Lab Results  Component Value Date   CHOL 185 01/21/2023   HDL 30 (L) 01/21/2023   LDLCALC 120 (H) 01/21/2023   TRIG 199 (H) 01/21/2023   CHOLHDL 6.2 (H) 01/21/2023    Medications Reviewed Today     Reviewed by Noe Gens,  Iran Planas, [facility] (Pharmacist) on 03/17/23 at 1527  Med List Status: <None>   Medication Order Taking? Sig Documenting Provider Last Dose Status Informant  allopurinol (ZYLOPRIM) 100 MG tablet [id number]  Take 2 tablets by mouth daily. Please schedule an appointment for further refills. Erik Senate, FNP  Active   Blood Glucose Monitoring Suppl [facility] VERIO FLEX SYSTEM) w/Device KIT [id number]  Test BS TID w/ meals Dx E11.69 Erik Senate, FNP  Active   Dulaglutide (TRULICITY) 3 MG/0.5ML [name] [id number]  Inject 3 mg as directed once a week for 4  doses. Erik Senate, FNP  Active   glucose blood [facility] VERIO) test strip [id number]  Test BS TID w/ meals Dx E11.69 Erik Senate, FNP  Active   Lancets [facility] ULTRASOFT) lancets [id number]  Test BS TID w/ meals Dx E11.69 Erik Senate, FNP  Active   losartan (COZAAR) 25 MG tablet [id number]  Take 1 tablet (25 mg total) by mouth daily. Erik Senate, FNP  Active   rosuvastatin (CRESTOR) 10 MG tablet [id number] Yes Take 1 tablet (10 mg total) by mouth daily. Erik Senate, FNP Taking Active   Vitamin D, Ergocalciferol, (DRISDOL) 1.25 MG (50000 UNIT) CAPS capsule [id number]  Take 1 capsule (50,000 Units total) by mouth every 7 (seven) days for 12 doses. Erik Senate, FNP  Active               Assessment/Plan:   Diabetes: - Currently uncontrolled based on last A1c 11.5% on 01/21/23 (up from 5.8% prior), but patient reported blood glucose readings are now at goal. He is tolerating Trulicity well, but unsure which dose he has been taking (0.75 mg vs. 1.5 mg). He threw the box away so unable to confirm. [facility] pharmacy and confirmed last dose dispensed was 1.5 mg on 02/17/23. He is tolerating well so we will increase the dose to maximize glycemic control and weight loss benefit (BMI 36). - Reviewed long term cardiovascular and renal outcomes of uncontrolled blood sugar - Reviewed goal A1c, goal fasting, and goal 2 hour post prandial glucose - Recommend to increase Trulicity to 3 mg daily as ordered by last week by PCP - Removed glipizide from med list as he appropriately stopped this when increasing Trulicity last month - Patient denies personal or family history of multiple endocrine neoplasia type 2, medullary thyroid cancer; personal history of pancreatitis or gallbladder disease. - Recommend to check fasting and 2 hr post prandial blood glucose daily - A1c due next month - encouraged patient to schedule PCP  visit     Hyperlipidemia/ASCVD Risk Reduction: - Currently uncontrolled  based on LDL 120 mg/dl, above goal <75 mg/dl given diagnosis of I4PP with risk factors HLD, HTN, and obesity. Since then he has started on moderate intensity statin. - Reviewed long term complications of uncontrolled cholesterol - Recommend to continue rosuvastatin 10 mg daily  - Lipid panel due next month - encouraged patient to schedule PCP visit   Follow Up Plan: Counseled patient to schedule appointment with PCP in 1 month, PharmD on 05/12/23   Jarrett Ables, PharmD PGY-1 Pharmacy Resident   Kieth Brightly, PharmD, BCACP, CPP Clinical Pharmacist, [facility]

## 2023-04-15 ENCOUNTER — Ambulatory Visit: Admitting: Family Medicine

## 2023-04-15 ENCOUNTER — Encounter: Payer: Self-pay | Admitting: Family Medicine

## 2023-05-06 ENCOUNTER — Ambulatory Visit: Payer: Self-pay | Admitting: Family Medicine

## 2023-05-07 ENCOUNTER — Ambulatory Visit (INDEPENDENT_AMBULATORY_CARE_PROVIDER_SITE_OTHER): Payer: Self-pay | Admitting: Family Medicine

## 2023-05-07 ENCOUNTER — Encounter: Payer: Self-pay | Admitting: Family Medicine

## 2023-05-07 VITALS — BP 121/75 | HR 82 | Temp 98.7°F | Ht 74.0 in | Wt 283.0 lb

## 2023-05-07 DIAGNOSIS — E1159 Type 2 diabetes mellitus with other circulatory complications: Secondary | ICD-10-CM | POA: Diagnosis not present

## 2023-05-07 DIAGNOSIS — G5603 Carpal tunnel syndrome, bilateral upper limbs: Secondary | ICD-10-CM | POA: Diagnosis not present

## 2023-05-07 DIAGNOSIS — E785 Hyperlipidemia, unspecified: Secondary | ICD-10-CM

## 2023-05-07 DIAGNOSIS — I152 Hypertension secondary to endocrine disorders: Secondary | ICD-10-CM

## 2023-05-07 DIAGNOSIS — E1169 Type 2 diabetes mellitus with other specified complication: Secondary | ICD-10-CM | POA: Diagnosis not present

## 2023-05-07 DIAGNOSIS — M1A00X0 Idiopathic chronic gout, unspecified site, without tophus (tophi): Secondary | ICD-10-CM | POA: Diagnosis not present

## 2023-05-07 DIAGNOSIS — Z7985 Long-term (current) use of injectable non-insulin antidiabetic drugs: Secondary | ICD-10-CM | POA: Diagnosis not present

## 2023-05-07 LAB — BAYER DCA HB A1C WAIVED: HB A1C (BAYER DCA - WAIVED): 5.4 % (ref 4.8–5.6)

## 2023-05-07 MED ORDER — TRULICITY 4.5 MG/0.5ML ~~LOC~~ SOAJ
4.5000 mg | SUBCUTANEOUS | 1 refills | Status: DC
Start: 2023-05-07 — End: 2023-07-02

## 2023-05-07 MED ORDER — ALLOPURINOL 100 MG PO TABS: ORAL_TABLET | ORAL | 2 refills | Status: AC

## 2023-05-07 MED ORDER — LOSARTAN POTASSIUM 25 MG PO TABS: 25.0000 mg | ORAL_TABLET | Freq: Every day | ORAL | 2 refills | Status: AC

## 2023-05-07 MED ORDER — ROSUVASTATIN CALCIUM 10 MG PO TABS: 10.0000 mg | ORAL_TABLET | Freq: Every day | ORAL | 3 refills | Status: AC

## 2023-05-07 NOTE — Progress Notes (Signed)
 Subjective:  Patient ID: Erik Davis, male    DOB: 06-04-1980, 43 y.o.   MRN: [id number]  Patient Care Team: Chrystine Crate, FNP as PCP - General (Family Medicine)   Chief Complaint:  Medical Management of Chronic Issues (Diabetes/)  HPI: Erik Davis is a 43 y.o. male presenting on 05/07/2023 for Medical Management of Chronic Issues (Diabetes/)  HPI Type 2 diabetes mellitus with other specified complication, without long-term current use of insulin (HCC) Glucometer:has glucometer.  High at home: 130s; Low at home: 14, after fasting for labs, no symptoms. Taking medication(s): Trulicity .   Last eye exam: competed in January  Last foot exam: due  Last A1c:  Lab Results  Component Value Date   HGBA1C 11.5 (H) 01/21/2023   Nephropathy screen indicated?: completed today  Last flu, zoster and/or pneumovax:  Immunization History  Administered Date(s) Administered   Influenza Split 10/16/2019   Influenza,inj,Quad PF,6+ Mos 09/16/2016, 10/28/2017   PFIZER(Purple Top)SARS-COV-2 Vaccination 06/01/2019, 06/22/2019, 12/23/2019   Tdap 06/04/2013   ROS: Denies dizziness, LOC, polyuria, polydipsia, unintended weight loss/gain, foot ulcerations, shortness of breath or chest pain.  Hyperlipidemia associated with type 2 diabetes mellitus (HCC) Taking crestor   Tolerating well  Working on diet and lifestyle modifications   Hypertension associated with type 2 diabetes mellitus (HCC) Has BP monitor at home Yes BP at home average 125/85 at 4:30 pm  ROS Denies anxiety, fatigue, peripheral edema, changes to vision, chest pain, headaches, palpitations, sweats, SOB, PND, orthopnea Vision has improve significantly since improving BG.  Meds losartan  25 mg   CAD risks Diabetes Mellitus, hypertension, hypercholesterolemia/hyperlipidemia  Carpal tunnel  States that it is flaring up again. Reports that in the past the injections have helped. He has not had them done since August. He would like  it done again today. Reports that this is different from gout pain. Has numbness and tingling. Reports that it will happen throughout the day and especially at night. Tries to sleep holding a pillow to extend his wrists.   Relevant past medical, surgical, family, and social history reviewed and updated as indicated.  Allergies and medications reviewed and updated. Data reviewed: Chart in Epic.   Past Medical History:  Diagnosis Date   Adult ADHD    managed by psychiatrist up until 09/2016   Bipolar affective disorder (HCC)    managed by psych up until 09/2016   Borderline hyperlipidemia 04/2017   Cellulitis of finger of right hand 11/05/2017   Cellulitis of thumb 11/06/2017   Diabetes mellitus (HCC) 2018   2018 HbA1c 5.9%.  Random gluc >200 and Hba1c 6.4% 02/2020.   Essential hypertension 2018   Fatty liver 2010   Noted on CT abd   Gout    two episodes1 yr apart--big toe.  Never been on preventative meds.   Nonspecific mesenteric adenitis 2010   Hx of   Obesity, Class II, BMI 35-39.9     Past Surgical History:  Procedure Laterality Date   CARDIOVASCULAR STRESS TEST  01/2021   EF 66%, no inducible ischemia.    Social History   Socioeconomic History   Marital status: Married    Spouse name: Not on file   Number of children: Not on file   Years of education: Not on file   Highest education level: 12th grade  Occupational History   Not on file  Tobacco Use   Smoking status: Former    Current packs/day: 0.00    Average packs/day: 0.5 packs/day for 7.0 years (  3.5 ttl pk-yrs)    Types: Cigarettes    Start date: 01/06/1997    Quit date: 01/07/2004    Years since quitting: 19.3   Smokeless tobacco: Never  Vaping Use   Vaping status: Never Used  Substance and Sexual Activity   Alcohol use: Yes    Comment: rare - 2-3 beers a year   Drug use: No   Sexual activity: Not on file  Other Topics Concern   Not on file  Social History Narrative   Single, two teenage sons.   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   No current tobacco: 5 yr pack hx--quit 2006.   No alcohol.   No hx of prob with alc or drugs.   Social Drivers of Health   Financial Resource Strain: Medium Risk (08/15/2022)   Overall Financial Resource Strain (CARDIA)    Difficulty of Paying Living Expenses: Somewhat hard  Food Insecurity: No Food Insecurity (08/15/2022)   Hunger Vital Sign    Worried About Running Out of Food in the Last Year: Never true    Ran Out of Food in the Last Year: Never true  Transportation Needs: No Transportation Needs (08/15/2022)   PRAPARE - Administrator, Civil Service (Medical): No    Lack of Transportation (Non-Medical): No  Physical Activity: Sufficiently Active (08/15/2022)   Exercise Vital Sign    Days of Exercise per Week: 5 days    Minutes of Exercise per Session: 60 min  Stress: No Stress Concern Present (08/15/2022)   [employer] of Occupational Health - Occupational Stress Questionnaire    Feeling of Stress : Not at all  Social Connections: Socially Isolated (08/15/2022)   Social Connection and Isolation Panel [NHANES]    Frequency of Communication with Friends and Family: Once a week    Frequency of Social Gatherings with Friends and Family: Once a week    Attends Religious Services: Never    Database administrator or Organizations: No    Attends Engineer, structural: Not on file    Marital Status: Living with partner  Intimate Partner Violence: Unknown (04/09/2021)   Received from [employer], [facility]   HITS    Physically Hurt: Not on file    Insult or Talk Down To: Not on file    Threaten Physical Harm: Not on file    Scream or Curse: Not on file    Outpatient Encounter Medications as of 05/07/2023  Medication Sig   allopurinol  (ZYLOPRIM ) 100 MG tablet Take 2 tablets by mouth daily. Please schedule an appointment for further refills.   Blood Glucose Monitoring Suppl (ONETOUCH VERIO FLEX SYSTEM) w/Device  KIT Test BS TID w/ meals Dx E11.69   glucose blood (ONETOUCH VERIO) test strip Test BS TID w/ meals Dx E11.69   Lancets (ONETOUCH ULTRASOFT) lancets Test BS TID w/ meals Dx E11.69   losartan  (COZAAR ) 25 MG tablet Take 1 tablet (25 mg total) by mouth daily.   rosuvastatin  (CRESTOR ) 10 MG tablet Take 1 tablet (10 mg total) by mouth daily.   No facility-administered encounter medications on file as of 05/07/2023.    Allergies  Allergen Reactions   Metformin  And Related Other (See Comments)    GI intolerance     Review of Systems As per HPI  Objective:  BP 121/75   Pulse 82   Temp 98.7 F (37.1 C)   Ht 6\' 2"  (1.88 m)   Wt 283 lb (128.4  kg)   SpO2 96%   BMI 36.34 kg/m    Wt Readings from Last 3 Encounters:  05/07/23 283 lb (128.4 kg)  02/04/23 286 lb (129.7 kg)  01/21/23 286 lb (129.7 kg)    Physical Exam Constitutional:      General: He is awake. He is not in acute distress.    Appearance: Normal appearance. He is well-developed and well-groomed. He is obese. He is not ill-appearing, toxic-appearing or diaphoretic.  Cardiovascular:     Rate and Rhythm: Normal rate and regular rhythm.     Pulses: Normal pulses.          Radial pulses are 2+ on the right side and 2+ on the left side.       Posterior tibial pulses are 2+ on the right side and 2+ on the left side.     Heart sounds: Normal heart sounds. No murmur heard.    No gallop.  Pulmonary:     Effort: Pulmonary effort is normal. No respiratory distress.     Breath sounds: Normal breath sounds. No stridor. No wheezing, rhonchi or rales.  Musculoskeletal:     Cervical back: Full passive range of motion without pain and neck supple.     Right lower leg: No edema.     Left lower leg: No edema.     Right foot: Normal range of motion. No deformity, bunion, Charcot foot, foot drop or prominent metatarsal heads.     Left foot: Normal range of motion. No deformity, bunion, Charcot foot, foot drop or prominent metatarsal heads.   Feet:     Right foot:     Protective Sensation: 10 sites tested.  10 sites sensed.     Skin integrity: Skin integrity normal.     Toenail Condition: Right toenails are normal.     Left foot:     Protective Sensation: 10 sites tested.  10 sites sensed.     Skin integrity: Skin integrity normal.     Toenail Condition: Left toenails are normal.  Skin:    General: Skin is warm.     Capillary Refill: Capillary refill takes less than 2 seconds.  Neurological:     General: No focal deficit present.     Mental Status: He is alert, oriented to person, place, and time and easily aroused. Mental status is at baseline.     GCS: GCS eye subscore is 4. GCS verbal subscore is 5. GCS motor subscore is 6.     Motor: No weakness.  Psychiatric:        Attention and Perception: Attention and perception normal.        Mood and Affect: Mood and affect normal.        Speech: Speech normal.        Behavior: Behavior normal. Behavior is cooperative.        Thought Content: Thought content normal. Thought content does not include homicidal or suicidal ideation. Thought content does not include homicidal or suicidal plan.        Cognition and Memory: Cognition and memory normal.        Judgment: Judgment normal.     Results for orders placed or performed in visit on 01/22/23  Microscopic Examination   Collection Time: 01/22/23 12:00 AM  Result Value Ref Range   WBC, UA None seen 0 - 5 /hpf   RBC, Urine None seen 0 - 2 /hpf   Epithelial Cells (non renal) None seen 0 - 10 /hpf  Renal Epithel, UA None seen None seen /hpf   Bacteria, UA None seen None seen/Few   Yeast, UA None seen None seen  Urinalysis, Routine w reflex microscopic   Collection Time: 01/22/23 12:00 AM  Result Value Ref Range   Specific Gravity, UA <1.005 (L) 1.005 - 1.030   pH, UA 6.0 5.0 - 7.5   Color, UA Yellow Yellow   Appearance Ur Clear Clear   Leukocytes,UA Negative Negative   Protein,UA Negative Negative/Trace   Glucose,  UA 3+ (A) Negative   Ketones, UA Negative Negative   RBC, UA Trace (A) Negative   Bilirubin, UA Negative Negative   Urobilinogen, Ur 0.2 0.2 - 1.0 mg/dL   Nitrite, UA Negative Negative   Microscopic Examination See below:        05/07/2023   10:12 AM 01/21/2023    1:46 PM 08/08/2022    3:56 PM 05/02/2022    2:12 PM 08/12/2018    3:24 PM  Depression screen PHQ 2/9  Decreased Interest 0 0 0 0 0  Down, Depressed, Hopeless 0 0 0 0 0  PHQ - 2 Score 0 0 0 0 0  Altered sleeping   0 2   Tired, decreased energy   0 0   Change in appetite   0 0   Feeling bad or failure about yourself    0 0   Trouble concentrating   0 0   Moving slowly or fidgety/restless   0 0   Suicidal thoughts   0 0   PHQ-9 Score   0 2   Difficult doing work/chores   Not difficult at all         05/07/2023   10:13 AM 01/21/2023    1:46 PM 08/08/2022    3:56 PM 05/02/2022    2:12 PM  GAD 7 : Generalized Anxiety Score  Nervous, Anxious, on Edge 0 0 0 0  Control/stop worrying 0 0 0 0  Worry too much - different things 0 0 0 0  Trouble relaxing 0 0 0 0  Restless 0 0 0 0  Easily annoyed or irritable 0 0 0 0  Afraid - awful might happen 0 0 0 0  Total GAD 7 Score 0 0 0 0  Anxiety Difficulty  Not difficult at all Not difficult at all Not difficult at all    Pertinent labs & imaging results that were available during my care of the patient were reviewed by me and considered in my medical decision making.  Assessment & Plan:  Deyon "Reymundo Caulk" was seen today for medical management of chronic issues.  Diagnoses and all orders for this visit:  1. Type 2 diabetes mellitus with other specified complication, without long-term current use of insulin (HCC) Well controlled. Will increase trulicity  as long as patient is not having episodes of hypoglycemia. Praised patient on diet and lifestyle modifications. - Microalbumin / creatinine urine ratio - CMP14+EGFR - VITAMIN D  25 Hydroxy (Vit-D Deficiency, Fractures) - Bayer DCA Hb  A1c Waived - Dulaglutide  (TRULICITY ) 4.5 MG/0.5ML SOAJ; Inject 4.5 mg as directed once a week.  Dispense: 6 mL; Refill: 1  2. Hyperlipidemia associated with type 2 diabetes mellitus (HCC) Labs as below. Will communicate results to patient once available. Will await results to determine next steps.  Continue crestor   - Lipid Panel - rosuvastatin  (CRESTOR ) 10 MG tablet; Take 1 tablet (10 mg total) by mouth daily.  Dispense: 90 tablet; Refill: 3  3. Hypertension associated with type 2 diabetes  mellitus (HCC) (Primary) Well controlled. Will continue ARB for renal protection with diabetes.  - losartan  (COZAAR ) 25 MG tablet; Take 1 tablet (25 mg total) by mouth daily.  Dispense: 90 tablet; Refill: 2  4. Morbid obesity (HCC) Praised patient on diet and lifestyle modifications. Continue trulicity .   5. Idiopathic chronic gout without tophus, unspecified site Continue current regimen.  - allopurinol  (ZYLOPRIM ) 100 MG tablet; Take 2 tablets by mouth daily. Please schedule an appointment for further refills.  Dispense: 180 tablet; Refill: 2  6. Bilateral carpal tunnel syndrome Will have patient return in one week for bilateral injections.   7. Long-term current use of injectable noninsulin antidiabetic medication As above.  - Dulaglutide  (TRULICITY ) 4.5 MG/0.5ML SOAJ; Inject 4.5 mg as directed once a week.  Dispense: 6 mL; Refill: 1   Continue all other maintenance medications.  Follow up plan: Return in about 1 week (around 05/14/2023) for carpal tunnel .   Continue healthy lifestyle choices, including diet (rich in fruits, vegetables, and lean proteins, and low in salt and simple carbohydrates) and exercise (at least 30 minutes of moderate physical activity daily).  Written and verbal instructions provided   The above assessment and management plan was discussed with the patient. The patient verbalized understanding of and has agreed to the management plan. Patient is aware to call the  clinic if they develop any new symptoms or if symptoms persist or worsen. Patient is aware when to return to the clinic for a follow-up visit. Patient educated on when it is appropriate to go to the emergency department.   Jacqualyn Mates, DNP-FNP [facility] [address] [place], [place] [address] [phone]

## 2023-05-08 ENCOUNTER — Encounter: Payer: Self-pay | Admitting: Family Medicine

## 2023-05-08 LAB — LIPID PANEL
Chol/HDL Ratio: 3.8 ratio (ref 0.0–5.0)
Cholesterol, Total: 143 mg/dL (ref 100–199)
HDL: 38 mg/dL — ABNORMAL LOW (ref >39)
LDL Chol Calc (NIH): 84 mg/dL (ref 0–99)
Triglycerides: 118 mg/dL (ref 0–149)
VLDL Cholesterol Cal: 21 mg/dL (ref 5–40)

## 2023-05-08 LAB — CMP14+EGFR
ALT: 23 IU/L (ref 0–44)
AST: 27 IU/L (ref 0–40)
Albumin: 4.7 g/dL (ref 4.1–5.1)
Alkaline Phosphatase: 59 IU/L (ref 44–121)
BUN/Creatinine Ratio: 11 (ref 9–20)
BUN: 13 mg/dL (ref 6–24)
Bilirubin Total: 0.4 mg/dL (ref 0.0–1.2)
CO2: 24 mmol/L (ref 20–29)
Calcium: 9.9 mg/dL (ref 8.7–10.2)
Chloride: 102 mmol/L (ref 96–106)
Creatinine, Ser: 1.18 mg/dL (ref 0.76–1.27)
Globulin, Total: 2.7 g/dL (ref 1.5–4.5)
Glucose: 95 mg/dL (ref 70–99)
Potassium: 4.2 mmol/L (ref 3.5–5.2)
Sodium: 141 mmol/L (ref 134–144)
Total Protein: 7.4 g/dL (ref 6.0–8.5)
eGFR: 78 mL/min/{1.73_m2} (ref >59)

## 2023-05-08 LAB — MICROALBUMIN / CREATININE URINE RATIO
Creatinine, Urine: 47.6 mg/dL
Microalb/Creat Ratio: <6 mg/g{creat} (ref 0–29)
Microalbumin, Urine: <3 ug/mL

## 2023-05-08 LAB — VITAMIN D 25 HYDROXY (VIT D DEFICIENCY, FRACTURES): Vit D, 25-Hydroxy: 40.1 ng/mL (ref 30.0–100.0)

## 2023-05-12 ENCOUNTER — Other Ambulatory Visit (HOSPITAL_COMMUNITY): Payer: Self-pay

## 2023-05-12 ENCOUNTER — Other Ambulatory Visit (INDEPENDENT_AMBULATORY_CARE_PROVIDER_SITE_OTHER): Admitting: Pharmacist

## 2023-05-12 DIAGNOSIS — E1169 Type 2 diabetes mellitus with other specified complication: Secondary | ICD-10-CM

## 2023-05-12 DIAGNOSIS — Z7985 Long-term (current) use of injectable non-insulin antidiabetic drugs: Secondary | ICD-10-CM

## 2023-05-12 DIAGNOSIS — E785 Hyperlipidemia, unspecified: Secondary | ICD-10-CM

## 2023-05-12 NOTE — Progress Notes (Unsigned)
 05/12/2023 Name: Erik Davis MRN: [id number] DOB: 27-Oct-1980  Chief Complaint  Patient presents with   Diabetes   Hyperlipidemia    Erik Davis is a 43 y.o. year old male who presented for a telephone visit.   They were referred to the pharmacist by their PCP for assistance in managing diabetes.   Subjective:  Care Team: Primary Care Provider: Chrystine Crate, FNP ; Next Scheduled Visit: not yet scheduled  Medication Access/Adherence  Current Pharmacy:  [facility] [address] [place] - [address] [place] [address] Phone: [phone] Fax: [phone]  [email or web address] - [facility] - [place], [place] - [address] [place] [address] Phone: [phone] Fax: [phone]   Patient reports affordability concerns with their medications: No  Patient reports access/transportation concerns to their pharmacy: No  Patient reports adherence concerns with their medications:  No     Diabetes:  Current medications: Trulicity  3 mg weekly (needs PA) - went last week and wasn't able to get   Patient reports he is tolerating Trulicity  well. Says this is the best he has felt in years. Has not picked up increase dose of Trulicity  4.5 mg yet as when he went to the pharmacy they said this needed a prior authorization. Overall has lost ~35 lbs while on Trulicity  and is trying to lose more weight.   Using glucometer; testing 2 times daily Fasting BG: 85-90 Before dinner: low 100s  Patient denies hypoglycemic s/sx including dizziness, shakiness, sweating. Patient denies hyperglycemic symptoms including polyuria, polydipsia, polyphagia, nocturia, neuropathy, blurred vision.  Current meal patterns: Has had appetite suppression with Trulicity  and is trying to eat smaller portions also to help with weight loss - 3 meals/day, all food is cooked at home- wife typically prepares - Breakfast:  breakfast burrito with egg and sausage  - Lunch: sandwich - Supper: chicken, broccoli, green beans, mashed potatoes - Snacks: does not snack - Drinks: water all day, has a glass of slightly sweet tea when he gets home from work, coffee in the morning   Current physical activity: has started going on walks in the evening for ~45 minutes each time 4 days a week; has a physical job working in a warehouse, 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    Hyperlipidemia/ASCVD Risk Reduction  Current lipid lowering medications: rosuvastatin  10 mg daily Medications tried in the past: none  ASCVD History: none Risk Factors: T2DM, HLD, HTN, obesity   The 10-year ASCVD risk score (Arnett DK, et al., 2019) is: 2.9%   Values used to calculate the score:     Age: 70 years     Sex: Male     Is Non-Hispanic African American: No     Diabetic: Yes     Tobacco smoker: No     Systolic Blood Pressure: 121 mmHg     Is BP treated: Yes     HDL Cholesterol: 38 mg/dL     Total Cholesterol: 143 mg/dL   Objective:  Lab Results  Component Value Date   HGBA1C 5.4 05/07/2023    Lab Results  Component Value Date   CREATININE 1.18 05/07/2023   BUN 13 05/07/2023   NA 141 05/07/2023   K 4.2 05/07/2023   CL 102 05/07/2023   CO2 24 05/07/2023    Lab Results  Component Value Date   CHOL 143 05/07/2023   HDL 38 (L) 05/07/2023  LDLCALC 84 05/07/2023   TRIG 118 05/07/2023   CHOLHDL 3.8 05/07/2023    Medications Reviewed Today     Reviewed by Erik Davis, RPH (Pharmacist) on 05/12/23 at 1310  Med List Status: <None>   Medication Order Taking? Sig Documenting Provider Last Dose Status Informant  allopurinol  (ZYLOPRIM ) 100 MG tablet [id number] Yes Take 2 tablets by mouth daily. Please schedule an appointment for further refills. Erik Crate, FNP Taking Active   Blood Glucose Monitoring Suppl [facility] VERIO FLEX SYSTEM) w/Device KIT [id number]  Test BS TID w/ meals Dx E11.69 Erik Crate, FNP  Active   Dulaglutide  (TRULICITY ) 4.5 MG/0.5ML [place] [id number] Yes Inject 4.5 mg as directed once a week. Erik Crate, FNP Taking Active   glucose blood [facility] VERIO) test strip [id number]  Test BS TID w/ meals Dx E11.69 Erik Crate, FNP  Active   Lancets [facility] ULTRASOFT) lancets [id number]  Test BS TID w/ meals Dx E11.69 Erik Crate, FNP  Active   losartan  (COZAAR ) 25 MG tablet [id number] Yes Take 1 tablet (25 mg total) by mouth daily. Erik Crate, FNP Taking Active   naproxen sodium (ALEVE) 220 MG tablet [id number] Yes Take 220 mg by mouth daily as needed. [provider] Taking Active   rosuvastatin  (CRESTOR ) 10 MG tablet [id number] Yes Take 1 tablet (10 mg total) by mouth daily. Erik Crate, FNP Taking Active               Assessment/Plan:   Diabetes: - Currently controlled with last A1C 5.4% (down from 11.5%) on 05/07/23. Patient reported BG readings are at goal. Appropriate to increase Trulicity  as previously recommended by PCP to maximize weight loss benefit and glycemic control (BMI is 36). - Reviewed long term cardiovascular and renal outcomes of uncontrolled blood sugar - Reviewed goal A1c, goal fasting, and goal 2 hour post prandial glucose - Reviewed dietary modifications including limiting portion sizes - Reviewed lifestyle modifications including: encouraged him to continue exercising - Recommend to increase Trulicity  to 4.5 mg weekly. PA approved (Key [id number]). - Patient denies personal or family history of multiple endocrine neoplasia type 2, medullary thyroid  cancer; personal history of pancreatitis or gallbladder disease. - Recommend to check fasting blood glucose daily   Hyperlipidemia/ASCVD Risk Reduction: - Currently close to goal with LDL improved to 84 mg/dL (down from 756) on 04/09/30. ASCVD Risk Score 2.9%. Appropriately on moderate intensity statin. Reviewed healthy lifestyle and diet  recommendations to further decrease risk including exercise, weight loss, and heart healthy diet (limiting processed foods, emphasized intake of lean proteins, fresh fruits and vegetables). - Reviewed long term complications of uncontrolled cholesterol - Recommend to continue rosuvastatin  10 mg daily   Follow Up Plan: PCP as recommended and PharmD as needed   Georga Killings, PharmD PGY-1 Pharmacy Resident  Marvell Slider, PharmD, BCACP, CPP Clinical Pharmacist, [facility]

## 2023-05-13 ENCOUNTER — Telehealth: Payer: Self-pay

## 2023-05-13 ENCOUNTER — Other Ambulatory Visit (HOSPITAL_COMMUNITY): Payer: Self-pay

## 2023-05-13 NOTE — Telephone Encounter (Signed)
 Pharmacy Patient Advocate Encounter  Received notification from [employer] that Prior Authorization for Trulicity  4.5 mg/0.5 ml auto injector has been APPROVED from 05/12/23 to 05/11/24. Unable to obtain price due to refill too soon rejection, last fill date 05/12/23 next available fill date05/28/25   PA #/Case ID/Reference #: --

## 2023-05-15 ENCOUNTER — Ambulatory Visit (INDEPENDENT_AMBULATORY_CARE_PROVIDER_SITE_OTHER): Admitting: Family Medicine

## 2023-05-15 ENCOUNTER — Encounter: Payer: Self-pay | Admitting: Family Medicine

## 2023-05-15 VITALS — BP 116/76 | HR 80 | Temp 98.7°F | Ht 74.0 in | Wt 285.0 lb

## 2023-05-15 DIAGNOSIS — G5603 Carpal tunnel syndrome, bilateral upper limbs: Secondary | ICD-10-CM | POA: Diagnosis not present

## 2023-05-15 MED ORDER — METHYLPREDNISOLONE ACETATE 80 MG/ML IJ SUSP
20.0000 mg | Freq: Once | INTRAMUSCULAR | Status: AC
  Administered 2023-05-15: 20 mg via INTRA_ARTICULAR

## 2023-05-15 NOTE — Progress Notes (Signed)
 Subjective:  Patient ID: Erik Davis, male    DOB: 1980/09/30, 43 y.o.   MRN: [id number]  Patient Care Team: Chrystine Crate, FNP as PCP - General (Family Medicine) Hyland Mailman, MD as Referring Physician (Optometry)   Chief Complaint:  Procedure (Bilateral carpal tunnel injection(s))  HPI: [name] is a 43 y.o. male presenting on 05/15/2023 for Procedure (Bilateral carpal tunnel injection(s))  HPI 1. Bilateral carpal tunnel syndrome Patient complains of pain in bilateral wrists from carpal tunnel. He has tried wearing braces at night. Injections in the past have helped him.    Relevant past medical, surgical, family, and social history reviewed and updated as indicated.  Allergies and medications reviewed and updated. Data reviewed: Chart in Epic.   Past Medical History:  Diagnosis Date   Adult ADHD    managed by psychiatrist up until 09/2016   Bipolar affective disorder (HCC)    managed by psych up until 09/2016   Borderline hyperlipidemia 04/2017   Cellulitis of finger of right hand 11/05/2017   Cellulitis of thumb 11/06/2017   Diabetes mellitus (HCC) 2018   2018 HbA1c 5.9%.  Random gluc >200 and Hba1c 6.4% 02/2020.   Essential hypertension 2018   Fatty liver 2010   Noted on CT abd   Gout    two episodes1 yr apart--big toe.  Never been on preventative meds.   Nonspecific mesenteric adenitis 2010   Hx of   Obesity, Class II, BMI 35-39.9     Past Surgical History:  Procedure Laterality Date   CARDIOVASCULAR STRESS TEST  01/2021   EF 66%, no inducible ischemia.    Social History   Socioeconomic History   Marital status: Married    Spouse name: Not on file   Number of children: Not on file   Years of education: Not on file   Highest education level: 12th grade  Occupational History   Not on file  Tobacco Use   Smoking status: Former    Current packs/day: 0.00    Average packs/day: 0.5 packs/day for 7.0 years (3.5 ttl pk-yrs)    Types:  Cigarettes    Start date: 01/06/1997    Quit date: 01/07/2004    Years since quitting: 19.3   Smokeless tobacco: Never  Vaping Use   Vaping status: Never Used  Substance and Sexual Activity   Alcohol use: Yes    Comment: rare - 2-3 beers a year   Drug use: No   Sexual activity: Not on file  Other Topics Concern   Not on file  Social History Narrative   Single, two teenage sons.   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   No current tobacco: 5 yr pack hx--quit 2006.   No alcohol.   No hx of prob with alc or drugs.   Social Drivers of Health   Financial Resource Strain: Medium Risk (08/15/2022)   Overall Financial Resource Strain (CARDIA)    Difficulty of Paying Living Expenses: Somewhat hard  Food Insecurity: No Food Insecurity (08/15/2022)   Hunger Vital Sign    Worried About Running Out of Food in the Last Year: Never true    Ran Out of Food in the Last Year: Never true  Transportation Needs: No Transportation Needs (08/15/2022)   PRAPARE - Administrator, Civil Service (Medical): No    Lack of Transportation (Non-Medical): No  Physical Activity: Sufficiently Active (08/15/2022)   Exercise Vital Sign  Days of Exercise per Week: 5 days    Minutes of Exercise per Session: 60 min  Stress: No Stress Concern Present (08/15/2022)   [employer] of Occupational Health - Occupational Stress Questionnaire    Feeling of Stress : Not at all  Social Connections: Socially Isolated (08/15/2022)   Social Connection and Isolation Panel [NHANES]    Frequency of Communication with Friends and Family: Once a week    Frequency of Social Gatherings with Friends and Family: Once a week    Attends Religious Services: Never    Database administrator or Organizations: No    Attends Engineer, structural: Not on file    Marital Status: Living with partner  Intimate Partner Violence: Unknown (04/09/2021)   Received from [employer], [facility]   HITS     Physically Hurt: Not on file    Insult or Talk Down To: Not on file    Threaten Physical Harm: Not on file    Scream or Curse: Not on file    Outpatient Encounter Medications as of 05/15/2023  Medication Sig   allopurinol  (ZYLOPRIM ) 100 MG tablet Take 2 tablets by mouth daily. Please schedule an appointment for further refills.   Blood Glucose Monitoring Suppl (ONETOUCH VERIO FLEX SYSTEM) w/Device KIT Test BS TID w/ meals Dx E11.69   Dulaglutide  (TRULICITY ) 4.5 MG/0.5ML SOAJ Inject 4.5 mg as directed once a week.   glucose blood (ONETOUCH VERIO) test strip Test BS TID w/ meals Dx E11.69   Lancets (ONETOUCH ULTRASOFT) lancets Test BS TID w/ meals Dx E11.69   losartan  (COZAAR ) 25 MG tablet Take 1 tablet (25 mg total) by mouth daily.   naproxen sodium (ALEVE) 220 MG tablet Take 220 mg by mouth daily as needed.   rosuvastatin  (CRESTOR ) 10 MG tablet Take 1 tablet (10 mg total) by mouth daily.   No facility-administered encounter medications on file as of 05/15/2023.    Allergies  Allergen Reactions   Metformin  And Related Other (See Comments)    GI intolerance     Review of Systems As per HPI  Objective:  BP 116/76   Pulse 80   Temp 98.7 F (37.1 C)   Ht 6\' 2"  (1.88 m)   Wt 285 lb (129.3 kg)   SpO2 98%   BMI 36.59 kg/m    Wt Readings from Last 3 Encounters:  05/15/23 285 lb (129.3 kg)  05/07/23 283 lb (128.4 kg)  02/04/23 286 lb (129.7 kg)   Physical Exam Constitutional:      General: He is awake. He is not in acute distress.    Appearance: Normal appearance. He is well-developed and well-groomed. He is obese. He is not ill-appearing, toxic-appearing or diaphoretic.  Cardiovascular:     Rate and Rhythm: Normal rate and regular rhythm.     Heart sounds: Normal heart sounds. No murmur heard.    No gallop.  Pulmonary:     Effort: Pulmonary effort is normal. No respiratory distress.     Breath sounds: Normal breath sounds. No stridor. No wheezing, rhonchi or rales.   Musculoskeletal:     Cervical back: Full passive range of motion without pain and neck supple.  Skin:    General: Skin is warm.     Capillary Refill: Capillary refill takes less than 2 seconds.  Neurological:     General: No focal deficit present.     Mental Status: He is alert, oriented to person, place, and time and easily aroused. Mental status  is at baseline.     GCS: GCS eye subscore is 4. GCS verbal subscore is 5. GCS motor subscore is 6.     Motor: No weakness.  Psychiatric:        Attention and Perception: Attention and perception normal.        Mood and Affect: Mood and affect normal.        Speech: Speech normal.        Behavior: Behavior normal. Behavior is cooperative.        Thought Content: Thought content normal. Thought content does not include homicidal or suicidal ideation. Thought content does not include homicidal or suicidal plan.        Cognition and Memory: Cognition and memory normal.        Judgment: Judgment normal.    Results for orders placed or performed in visit on 05/07/23  Bayer DCA Hb A1c Waived   Collection Time: 05/07/23  9:49 AM  Result Value Ref Range   HB A1C (BAYER DCA - WAIVED) 5.4 4.8 - 5.6 %  CMP14+EGFR   Collection Time: 05/07/23  9:54 AM  Result Value Ref Range   Glucose 95 70 - 99 mg/dL   BUN 13 6 - 24 mg/dL   Creatinine, Ser 5.46 0.76 - 1.27 mg/dL   eGFR 78 >27 OJ/JKK/9.38   BUN/Creatinine Ratio 11 9 - 20   Sodium 141 134 - 144 mmol/L   Potassium 4.2 3.5 - 5.2 mmol/L   Chloride 102 96 - 106 mmol/L   CO2 24 20 - 29 mmol/L   Calcium  9.9 8.7 - 10.2 mg/dL   Total Protein 7.4 6.0 - 8.5 g/dL   Albumin 4.7 4.1 - 5.1 g/dL   Globulin, Total 2.7 1.5 - 4.5 g/dL   Bilirubin Total 0.4 0.0 - 1.2 mg/dL   Alkaline Phosphatase 59 44 - 121 IU/L   AST 27 0 - 40 IU/L   ALT 23 0 - 44 IU/L  VITAMIN D  25 Hydroxy (Vit-D Deficiency, Fractures)   Collection Time: 05/07/23  9:54 AM  Result Value Ref Range   Vit D, 25-Hydroxy 40.1 30.0 - 100.0 ng/mL   Lipid Panel   Collection Time: 05/07/23  9:55 AM  Result Value Ref Range   Cholesterol, Total 143 100 - 199 mg/dL   Triglycerides 182 0 - 149 mg/dL   HDL 38 (L) >99 mg/dL   VLDL Cholesterol Cal 21 5 - 40 mg/dL   LDL Chol Calc (NIH) 84 0 - 99 mg/dL   Chol/HDL Ratio 3.8 0.0 - 5.0 ratio  Microalbumin / creatinine urine ratio   Collection Time: 05/07/23 10:11 AM  Result Value Ref Range   Creatinine, Urine 47.6 Not Estab. mg/dL   Microalbumin, Urine <3.7 Not Estab. ug/mL   Microalb/Creat Ratio <6 0 - 29 mg/g creat       05/15/2023    8:02 AM 05/07/2023   10:12 AM 01/21/2023    1:46 PM 08/08/2022    3:56 PM 05/02/2022    2:12 PM  Depression screen PHQ 2/9  Decreased Interest 0 0 0 0 0  Down, Depressed, Hopeless 0 0 0 0 0  PHQ - 2 Score 0 0 0 0 0  Altered sleeping    0 2  Tired, decreased energy    0 0  Change in appetite    0 0  Feeling bad or failure about yourself     0 0  Trouble concentrating    0 0  Moving slowly or  fidgety/restless    0 0  Suicidal thoughts    0 0  PHQ-9 Score    0 2  Difficult doing work/chores    Not difficult at all        05/07/2023   10:13 AM 01/21/2023    1:46 PM 08/08/2022    3:56 PM 05/02/2022    2:12 PM  GAD 7 : Generalized Anxiety Score  Nervous, Anxious, on Edge 0 0 0 0  Control/stop worrying 0 0 0 0  Worry too much - different things 0 0 0 0  Trouble relaxing 0 0 0 0  Restless 0 0 0 0  Easily annoyed or irritable 0 0 0 0  Afraid - awful might happen 0 0 0 0  Total GAD 7 Score 0 0 0 0  Anxiety Difficulty  Not difficult at all Not difficult at all Not difficult at all   Joint Injection/Arthrocentesis  Date/Time: 05/15/2023 8:11 AM  Performed by: Chrystine Crate, FNP Authorized by: Chrystine Crate, FNP  Indications: pain  Body area: wrist (left wrist) Joint: left wrist  Sedation: Patient sedated: no  Preparation: Patient was prepped and draped in the usual sterile fashion. Needle gauge: 25G. Ultrasound guidance:  no Approach: medial Aspirate amount: 0 mL Lidocaine 2% amount: 0.25 mL Patient tolerance: patient tolerated the procedure well with no immediate complications Comments: Depo 20 mg    Joint Injection/Arthrocentesis  Date/Time: 05/15/2023 8:16 AM  Performed by: Chrystine Crate, FNP Authorized by: Chrystine Crate, FNP  Indications: pain  Body area: wrist Joint: right wrist Local anesthesia used: no  Anesthesia: Local anesthesia used: no  Sedation: Patient sedated: no  Needle gauge: 25. Ultrasound guidance: no Approach: lateral Aspirate amount: 0 mL Lidocaine 2% amount: 0.25 mL Patient tolerance: patient tolerated the procedure well with no immediate complications Comments: 20 mg Depo     Pertinent labs & imaging results that were available during my care of the patient were reviewed by me and considered in my medical decision making.  Assessment & Plan:  Lasean "Reymundo Caulk" was seen today for procedure.  Diagnoses and all orders for this visit:  Bilateral carpal tunnel syndrome Injections provided as below. Discussed risks and site care with patient. Continue at home therapy with braces and tylenol /ibuprofen as needed. Left wrist with proximal approch this time, if not as effective, patient to notify provider.  -     Joint Injection/Arthrocentesis -     methylPREDNISolone  acetate (DEPO-MEDROL ) injection 20 mg -     methylPREDNISolone  acetate (DEPO-MEDROL ) injection 20 mg -     Joint Injection/Arthrocentesis  Continue all other maintenance medications.  Follow up plan: Return if symptoms worsen or fail to improve.   Continue healthy lifestyle choices, including diet (rich in fruits, vegetables, and lean proteins, and low in salt and simple carbohydrates) and exercise (at least 30 minutes of moderate physical activity daily).  Written and verbal instructions provided   The above assessment and management plan was discussed with the patient. The patient  verbalized understanding of and has agreed to the management plan. Patient is aware to call the clinic if they develop any new symptoms or if symptoms persist or worsen. Patient is aware when to return to the clinic for a follow-up visit. Patient educated on when it is appropriate to go to the emergency department.   Jacqualyn Mates, DNP-FNP [facility] [address] [place], [place] [address] [phone]

## 2023-07-02 ENCOUNTER — Other Ambulatory Visit: Payer: Self-pay | Admitting: Family Medicine

## 2023-07-02 DIAGNOSIS — E1169 Type 2 diabetes mellitus with other specified complication: Secondary | ICD-10-CM

## 2023-07-02 DIAGNOSIS — Z7985 Long-term (current) use of injectable non-insulin antidiabetic drugs: Secondary | ICD-10-CM

## 2023-07-02 MED ORDER — TRULICITY 4.5 MG/0.5ML ~~LOC~~ SOAJ
4.5000 mg | SUBCUTANEOUS | 1 refills | Status: DC
Start: 2023-07-02 — End: 2023-09-01

## 2023-07-02 NOTE — Progress Notes (Signed)
 Refill provided

## 2023-09-01 ENCOUNTER — Telehealth: Payer: Self-pay | Admitting: Family Medicine

## 2023-09-01 MED ORDER — SEMAGLUTIDE (1 MG/DOSE) 4 MG/3ML ~~LOC~~ SOPN: 1.0000 mg | PEN_INJECTOR | SUBCUTANEOUS | 0 refills | Status: DC

## 2023-09-01 NOTE — Telephone Encounter (Signed)
 Please advise on alternative for Trulicity  since it's not covered by insurance

## 2023-09-01 NOTE — Telephone Encounter (Signed)
 Copied from CRM #[id number]. Topic: Clinical - Prescription Issue >> Aug 31, 2023  4:57 PM Zebedee SAUNDERS wrote: Reason for CRM: Pt just found out that [facility] [id number] [place] [address] Phone: [phone] Fax: [phone] was running a discount for pt. Pt discovered that insurance will not cover medication Dulaglutide  (TRULICITY ) 4.5 MG/0.5ML SOAJ. Pt needs new script for similar medication. Pt is schedule with Shelvy Morton Hummer  on 10/21/2023. Pt would like a call back at [phone].

## 2023-09-01 NOTE — Telephone Encounter (Signed)
 Since insurance will no longer pay for trulicity . New prescription was sent in for ozempic . Have to start at lower dose to prevent sid effects. CANDIE Deitra Ned can adjust meds as needed at next appointment  Mary-Margaret Gladis, FNP

## 2023-09-01 NOTE — Telephone Encounter (Signed)
 Patient called again regarding medication.  No one called to let him know that a new medication was prescribed.  The message was read to him and he will pick up now medication today from his pharmacy.  He will call back if he has any questions.  Thanks.

## 2023-09-02 ENCOUNTER — Ambulatory Visit

## 2023-09-08 ENCOUNTER — Encounter: Payer: Self-pay | Admitting: Nurse Practitioner

## 2023-09-08 ENCOUNTER — Ambulatory Visit (INDEPENDENT_AMBULATORY_CARE_PROVIDER_SITE_OTHER): Admitting: Nurse Practitioner

## 2023-09-08 VITALS — BP 129/73 | HR 91 | Temp 98.2°F | Ht 74.0 in | Wt 281.6 lb

## 2023-09-08 DIAGNOSIS — E785 Hyperlipidemia, unspecified: Secondary | ICD-10-CM | POA: Diagnosis not present

## 2023-09-08 DIAGNOSIS — I152 Hypertension secondary to endocrine disorders: Secondary | ICD-10-CM

## 2023-09-08 DIAGNOSIS — Z23 Encounter for immunization: Secondary | ICD-10-CM | POA: Diagnosis not present

## 2023-09-08 DIAGNOSIS — Z7984 Long term (current) use of oral hypoglycemic drugs: Secondary | ICD-10-CM

## 2023-09-08 DIAGNOSIS — E559 Vitamin D deficiency, unspecified: Secondary | ICD-10-CM | POA: Diagnosis not present

## 2023-09-08 DIAGNOSIS — E1169 Type 2 diabetes mellitus with other specified complication: Secondary | ICD-10-CM | POA: Diagnosis not present

## 2023-09-08 DIAGNOSIS — M1A9XX0 Chronic gout, unspecified, without tophus (tophi): Secondary | ICD-10-CM | POA: Diagnosis not present

## 2023-09-08 DIAGNOSIS — E1159 Type 2 diabetes mellitus with other circulatory complications: Secondary | ICD-10-CM | POA: Diagnosis not present

## 2023-09-08 LAB — BAYER DCA HB A1C WAIVED: HB A1C (BAYER DCA - WAIVED): 5.1 % (ref 4.8–5.6)

## 2023-09-08 MED ORDER — METFORMIN HCL ER 500 MG PO TB24: 500.0000 mg | ORAL_TABLET | Freq: Every day | ORAL | 0 refills | Status: DC

## 2023-09-08 NOTE — Progress Notes (Signed)
 Established Patient Office Visit  Subjective  Patient ID: Erik Davis, male    DOB: 10-19-1980  Age: 43 y.o. MRN: [id number]  Chief Complaint  Patient presents with   Establish Care    Re est - needs alternative to trulicity      HPI  Erik Davis 43 year old male presented September 08, 2023 to reestablish care since his old PCP is no longer at the practice.  Past medical history of diabetes, hyperlipidemia, gout Diabetes: Patient reports that he has been on Trulicity  for over 15 months, intermittently. Recently, when attempting to refill at [facility], he was informed that the cost would be $1,100. Patient states, "I was told that [facility] was scanning a discount and has stopped doing it. Now I have to meet my deductible." He reports a prior trial of Metformin , which was discontinued due to gastrointestinal intolerance. Patient is agreeable to starting Metformin  ER.  Lab Results  Component Value Date   HGBA1C 5.4 05/07/2023   HGBA1C 11.5 (H) 01/21/2023   HGBA1C 5.8 (H) 08/08/2022   Wt Readings from Last 3 Encounters:  09/08/23 281 lb 9.6 oz (127.7 kg)  05/15/23 285 lb (129.3 kg)  05/07/23 283 lb (128.4 kg)   Last seen for diabetes 4 months ago.  Management since then includes Trulicity . He reports excellent compliance with treatment. He is not having side effects.  Symptoms: No fatigue No foot ulcerations  No appetite changes No nausea  No paresthesia of the feet  No polydipsia  No polyuria No visual disturbances   No vomiting     Home blood sugar records: fasting range: 110  Episodes of hypoglycemia? No    Current insulin regiment: Not on insulin  Most Recent Eye Exam: 2 months ago will need to get records from [facility] Current exercise: walking Current diet habits: in general, a healthy diet    Pertinent Labs: Lab Results  Component Value Date   CHOL 143 05/07/2023   HDL 38 (L) 05/07/2023   LDLCALC 84 05/07/2023   TRIG 118 05/07/2023   CHOLHDL 3.8 05/07/2023    Lab Results  Component Value Date   NA 141 05/07/2023   K 4.2 05/07/2023   CREATININE 1.18 05/07/2023   EGFR 78 05/07/2023   MICRALBCREAT <6 05/07/2023     Lipid/Cholesterol, Follow-up  Last lipid panel Other pertinent labs  Lab Results  Component Value Date   CHOL 143 05/07/2023   HDL 38 (L) 05/07/2023   LDLCALC 84 05/07/2023   TRIG 118 05/07/2023   CHOLHDL 3.8 05/07/2023   Lab Results  Component Value Date   ALT 23 05/07/2023   AST 27 05/07/2023   PLT 249 01/21/2023   TSH 1.040 01/21/2023     He was last seen for this 4 months ago.  Management since that visit includes Crestor  10 mg daily.  He reports excellent compliance with treatment. He is not having side effects.   Symptoms: No chest pain No chest pressure/discomfort  No dyspnea No lower extremity edema  No numbness or tingling of extremity No orthopnea  No palpitations No paroxysmal nocturnal dyspnea  No speech difficulty No syncope   Current diet: in general, a healthy diet   Current exercise: walking  The 10-year ASCVD risk score (Arnett DK, et al., 2019) is: 3.2%   Gout: Patient here for evaluation of chronic gout. The patient reports no acute gout attacks since last clinic visit. Attacks occur primarily in the right foot. Patient reports his chronic pain is completed, his joint stiffness  is completed and his joint swelling is completed. Limitation on activities include none. The patient is not avoiding high purine foods and reports consuming 0 alcoholic drinks per months.   Vitamin D  deficiency, follow-up  Lab Results  Component Value Date   VD25OH 40.1 05/07/2023   VD25OH 15.8 (L) 01/21/2023   CALCIUM  9.9 05/07/2023   CALCIUM  9.7 01/21/2023        Wt Readings from Last 3 Encounters:  09/08/23 281 lb 9.6 oz (127.7 kg)  05/15/23 285 lb (129.3 kg)  05/07/23 283 lb (128.4 kg)   He was last seen for vitamin D  deficiency 4 months ago.  Management since that visit includes vit D  supplement. He reports excellent compliance with treatment. He is not having side effects.  Symptoms: No change in energy level No numbness or tingling  No bone pain No unexplained fracture  Obese  The patient is obese with a BMI of 36.16. He reports that he has recently made changes to his diet by reducing portion sizes, limiting fried and processed foods, and increasing vegetable intake. He is also making efforts to stay more active, incorporating walking and light exercise into his daily routine. The patient has a history of type 2 diabetes, and hyperlipidemia if applicable. He denies chest pain, palpitations, shortness of breath, dizziness, or lower extremity edema. He reports no recent hospitalizations or acute illnesses. The patient acknowledges challenges with maintaining long-term consistency but is motivated to continue working on weight reduction and improving overall health.  Patient Active Problem List   Diagnosis Date Noted   Vitamin D  deficiency 09/08/2023   Long-term current use of injectable noninsulin antidiabetic medication 05/07/2023   Hyperlipidemia associated with type 2 diabetes mellitus (HCC) 06/06/2022   Carpal tunnel syndrome 05/02/2022   Gout 05/02/2022   Morbid obesity (HCC) 08/12/2018   Type 2 diabetes mellitus with other specified complication (HCC) 01/07/2016   Hypertension associated with type 2 diabetes mellitus (HCC) 01/07/2016   Past Medical History:  Diagnosis Date   Adult ADHD    managed by psychiatrist up until 09/2016   Bipolar affective disorder (HCC)    managed by psych up until 09/2016   Borderline hyperlipidemia 04/2017   Cellulitis of finger of right hand 11/05/2017   Cellulitis of thumb 11/06/2017   Diabetes mellitus (HCC) 2018   2018 HbA1c 5.9%.  Random gluc >200 and Hba1c 6.4% 02/2020.   Essential hypertension 2018   Fatty liver 2010   Noted on CT abd   Gout    two episodes1 yr apart--big toe.  Never been on preventative meds.    Nonspecific mesenteric adenitis 2010   Hx of   Obesity, Class II, BMI 35-39.9    Past Surgical History:  Procedure Laterality Date   CARDIOVASCULAR STRESS TEST  01/2021   EF 66%, no inducible ischemia.   Social History   Tobacco Use   Smoking status: Former    Current packs/day: 0.00    Average packs/day: 0.5 packs/day for 7.0 years (3.5 ttl pk-yrs)    Types: Cigarettes    Start date: 01/06/1997    Quit date: 01/07/2004    Years since quitting: 19.6   Smokeless tobacco: Never  Vaping Use   Vaping status: Never Used  Substance Use Topics   Alcohol use: Yes    Comment: rare - 2-3 beers a year   Drug use: No   Social History   Socioeconomic History   Marital status: Married    Spouse name: Not on file  Number of children: Not on file   Years of education: Not on file   Highest education level: 12th grade  Occupational History   Not on file  Tobacco Use   Smoking status: Former    Current packs/day: 0.00    Average packs/day: 0.5 packs/day for 7.0 years (3.5 ttl pk-yrs)    Types: Cigarettes    Start date: 01/06/1997    Quit date: 01/07/2004    Years since quitting: 19.6   Smokeless tobacco: Never  Vaping Use   Vaping status: Never Used  Substance and Sexual Activity   Alcohol use: Yes    Comment: rare - 2-3 beers a year   Drug use: No   Sexual activity: Not on file  Other Topics Concern   Not on file  Social History Narrative   Single, two teenage sons.   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   No current tobacco: 5 yr pack hx--quit 2006.   No alcohol.   No hx of prob with alc or drugs.   Social Drivers of Health   Financial Resource Strain: Medium Risk (08/15/2022)   Overall Financial Resource Strain (CARDIA)    Difficulty of Paying Living Expenses: Somewhat hard  Food Insecurity: No Food Insecurity (08/15/2022)   Hunger Vital Sign    Worried About Running Out of Food in the Last Year: Never true    Ran Out of Food in the Last Year: Never true   Transportation Needs: No Transportation Needs (08/15/2022)   PRAPARE - Administrator, Civil Service (Medical): No    Lack of Transportation (Non-Medical): No  Physical Activity: Sufficiently Active (08/15/2022)   Exercise Vital Sign    Days of Exercise per Week: 5 days    Minutes of Exercise per Session: 60 min  Stress: No Stress Concern Present (08/15/2022)   [employer] of Occupational Health - Occupational Stress Questionnaire    Feeling of Stress : Not at all  Social Connections: Socially Isolated (08/15/2022)   Social Connection and Isolation Panel    Frequency of Communication with Friends and Family: Once a week    Frequency of Social Gatherings with Friends and Family: Once a week    Attends Religious Services: Never    Database administrator or Organizations: No    Attends Engineer, structural: Not on file    Marital Status: Living with partner  Intimate Partner Violence: Unknown (04/09/2021)   Received from [facility]   HITS    Physically Hurt: Not on file    Insult or Talk Down To: Not on file    Threaten Physical Harm: Not on file    Scream or Curse: Not on file   Family Status  Relation Name Status   Mother  Alive   Father  Alive   MGM  Deceased   PGM  Deceased  No partnership data on file   Family History  Problem Relation Age of Onset   Mental retardation Father    Depression Maternal Grandmother    Cancer Paternal Grandmother        Lung?   No Known Allergies     Review of Systems  Constitutional:  Negative for chills and fever.  HENT:  Negative for congestion and sore throat.   Respiratory:  Negative for cough and shortness of breath.   Cardiovascular:  Negative for chest pain and leg swelling.  Gastrointestinal:  Negative for blood in stool, constipation, diarrhea, nausea and  vomiting.  Musculoskeletal:  Negative for falls.  Skin:  Negative for itching and rash.  Neurological:  Negative for dizziness.   Negative unless  indicated in HPI   Objective:     BP 129/73   Pulse 91   Temp 98.2 F (36.8 C) (Temporal)   Ht 6' 2 (1.88 m)   Wt 281 lb 9.6 oz (127.7 kg)   SpO2 97%   BMI 36.16 kg/m  BP Readings from Last 3 Encounters:  09/08/23 129/73  05/15/23 116/76  05/07/23 121/75   Wt Readings from Last 3 Encounters:  09/08/23 281 lb 9.6 oz (127.7 kg)  05/15/23 285 lb (129.3 kg)  05/07/23 283 lb (128.4 kg)      Physical Exam Vitals and nursing note reviewed.  Constitutional:      General: He is not in acute distress.    Appearance: Normal appearance.  HENT:     Head: Normocephalic and atraumatic.     Right Ear: Tympanic membrane, ear canal and external ear normal. There is no impacted cerumen.     Left Ear: Tympanic membrane and external ear normal. There is no impacted cerumen.     Nose: Nose normal.     Mouth/Throat:     Mouth: Mucous membranes are moist.  Eyes:     General: No scleral icterus.    Extraocular Movements: Extraocular movements intact.     Conjunctiva/sclera: Conjunctivae normal.     Pupils: Pupils are equal, round, and reactive to light.  Neck:     Vascular: No carotid bruit.  Cardiovascular:     Heart sounds: Normal heart sounds.  Pulmonary:     Effort: Pulmonary effort is normal.     Breath sounds: Normal breath sounds.  Abdominal:     General: Bowel sounds are normal.     Palpations: Abdomen is soft.  Musculoskeletal:        General: Normal range of motion.     Cervical back: Normal range of motion and neck supple. No rigidity or tenderness.     Right lower leg: No edema.     Left lower leg: No edema.  Lymphadenopathy:     Cervical: No cervical adenopathy.  Skin:    General: Skin is warm and dry.  Neurological:     Mental Status: He is alert and oriented to person, place, and time.  Psychiatric:        Mood and Affect: Mood normal.        Behavior: Behavior normal.        Thought Content: Thought content normal.        Judgment: Judgment normal.       No results found for any visits on 09/08/23.  Last CBC Lab Results  Component Value Date   WBC 5.2 01/21/2023   HGB 15.5 01/21/2023   HCT 46.6 01/21/2023   MCV 86 01/21/2023   MCH 28.7 01/21/2023   RDW 11.8 01/21/2023   PLT 249 01/21/2023   Last metabolic panel Lab Results  Component Value Date   GLUCOSE 95 05/07/2023   NA 141 05/07/2023   K 4.2 05/07/2023   CL 102 05/07/2023   CO2 24 05/07/2023   BUN 13 05/07/2023   CREATININE 1.18 05/07/2023   EGFR 78 05/07/2023   CALCIUM  9.9 05/07/2023   PROT 7.4 05/07/2023   ALBUMIN 4.7 05/07/2023   LABGLOB 2.7 05/07/2023   AGRATIO 1.5 05/05/2022   BILITOT 0.4 05/07/2023   ALKPHOS 59 05/07/2023   AST 27 05/07/2023  ALT 23 05/07/2023   Last lipids Lab Results  Component Value Date   CHOL 143 05/07/2023   HDL 38 (L) 05/07/2023   LDLCALC 84 05/07/2023   TRIG 118 05/07/2023   CHOLHDL 3.8 05/07/2023   Last hemoglobin A1c Lab Results  Component Value Date   HGBA1C 5.4 05/07/2023   Last thyroid  functions Lab Results  Component Value Date   TSH 1.040 01/21/2023        Assessment & Plan:  Type 2 diabetes mellitus with other specified complication, without long-term current use of insulin (HCC) -     Bayer DCA Hb A1c Waived  Hyperlipidemia associated with type 2 diabetes mellitus (HCC)  Hypertension associated with type 2 diabetes mellitus (HCC)  Morbid obesity (HCC)  Chronic gout without tophus, unspecified cause, unspecified site  Vitamin D  deficiency -     VITAMIN D  25 Hydroxy (Vit-D Deficiency, Fractures)  Immunization due -     Tdap vaccine greater than or equal to 7yo IM  Encounter for immunization -     Flu vaccine trivalent PF, 6mos and older(Flulaval,Afluria,Fluarix,Fluzone)  Other orders -     metFORMIN  HCl ER; Take 1 tablet (500 mg total) by mouth daily with breakfast.  Dispense: 90 tablet; Refill: 0   Montague is a 43 year old Caucasian male seen today to reestablish care for chronic  disease management, no acute distress Vitamin D  deficiency: Plan to recheck vitamin D  and determine the need to continue vitamin D  supplement Hyperlipidemia: Continue Crestor  10 mg daily no refill needed Gout: Continue allopurinol  100 mg daily no refill needed Diabetes: DC Trulicity  due to cost, and start client on metformin  extended release due to GI concern overweight standard release metformin  in the past Hypertension: Continue losartan  10 mg daily no refill needed Labs: Vitamin D  result pending The above assessment and management plan was discussed with the patient. The patient verbalized understanding of and has agreed to the management plan. Patient is aware to call the clinic if they develop any new symptoms or if symptoms persist or worsen. Patient is aware when to return to the clinic for a follow-up visit. Patient educated on when it is appropriate to go to the emergency department.  Health maintenance: Tdap and influenza vaccine administered today Return in about 3 months (around 12/08/2023) for chronic Diseases Management.    Damon Baisch [place] Thompson, DNP [facility] [address] [place], [place] [address] [phone]    Note: This document was prepared by [employer] voice dictation technology and any errors that results from this process are unintentional.

## 2023-09-09 LAB — VITAMIN D 25 HYDROXY (VIT D DEFICIENCY, FRACTURES): Vit D, 25-Hydroxy: 31 ng/mL (ref 30.0–100.0)

## 2023-09-14 ENCOUNTER — Other Ambulatory Visit: Payer: Self-pay | Admitting: Nurse Practitioner

## 2023-09-14 ENCOUNTER — Ambulatory Visit: Payer: Self-pay | Admitting: Nurse Practitioner

## 2023-09-14 DIAGNOSIS — E559 Vitamin D deficiency, unspecified: Secondary | ICD-10-CM

## 2023-09-14 MED ORDER — VITAMIN D3 50 MCG (2000 UT) PO CAPS: 2000.0000 [IU] | ORAL_CAPSULE | Freq: Every day | ORAL | 0 refills | Status: AC

## 2023-09-14 MED ORDER — VITAMIN D 50 MCG (2000 UT) PO TABS: 2000.0000 [IU] | ORAL_TABLET | Freq: Every day | ORAL | 0 refills | Status: AC

## 2023-10-20 NOTE — Progress Notes (Unsigned)
 Subjective:  Patient ID: Erik Davis, male    DOB: [birth year], 43 y.o.   MRN: [id number]  Patient Care Team: Deitra Morton Sebastian Nena, NP as PCP - General (Nurse Practitioner) Ladora Ross Lacy Phebe, MD as Referring Physician (Optometry)   Chief Complaint:  No chief complaint on file.   HPI: Erik Davis is a 43 y.o. male presenting on 10/21/2023 for No chief complaint on file.   Discussed the use of AI scribe software for clinical note transcription with the patient, who gave verbal consent to proceed.  History of Present Illness       Relevant past medical, surgical, family, and social history reviewed and updated as indicated.  Allergies and medications reviewed and updated. Data reviewed: Chart in Epic.   Past Medical History:  Diagnosis Date   Adult ADHD    managed by psychiatrist up until 09/2016   Bipolar affective disorder (HCC)    managed by psych up until 09/2016   Borderline hyperlipidemia 04/2017   Cellulitis of finger of right hand 11/05/2017   Cellulitis of thumb 11/06/2017   Diabetes mellitus (HCC) 2018   2018 HbA1c 5.9%.  Random gluc >200 and Hba1c 6.4% 02/2020.   Essential hypertension 2018   Fatty liver 2010   Noted on CT abd   Gout    two episodes1 yr apart--big toe.  Never been on preventative meds.   Nonspecific mesenteric adenitis 2010   Hx of   Obesity, Class II, BMI 35-39.9     Past Surgical History:  Procedure Laterality Date   CARDIOVASCULAR STRESS TEST  01/2021   EF 66%, no inducible ischemia.    Social History   Socioeconomic History   Marital status: Married    Spouse name: Not on file   Number of children: Not on file   Years of education: Not on file   Highest education level: 12th grade  Occupational History   Not on file  Tobacco Use   Smoking status: Former    Current packs/day: 0.00    Average packs/day: 0.5 packs/day for 7.0 years (3.5 ttl pk-yrs)    Types: Cigarettes    Start date: 01/06/1997    Quit date: 01/07/2004     Years since quitting: 19.7   Smokeless tobacco: Never  Vaping Use   Vaping status: Never Used  Substance and Sexual Activity   Alcohol use: Yes    Comment: rare - 2-3 beers a year   Drug use: No   Sexual activity: Not on file  Other Topics Concern   Not on file  Social History Narrative   Single, two teenage sons.   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   No current tobacco: 5 yr pack hx--quit 2006.   No alcohol.   No hx of prob with alc or drugs.   Social Drivers of Health   Financial Resource Strain: Medium Risk (08/15/2022)   Overall Financial Resource Strain (CARDIA)    Difficulty of Paying Living Expenses: Somewhat hard  Food Insecurity: No Food Insecurity (08/15/2022)   Hunger Vital Sign    Worried About Running Out of Food in the Last Year: Never true    Ran Out of Food in the Last Year: Never true  Transportation Needs: No Transportation Needs (08/15/2022)   PRAPARE - Administrator, Civil Service (Medical): No    Lack of Transportation (Non-Medical): No  Physical Activity: Sufficiently Active (08/15/2022)   Exercise Vital Sign  Days of Exercise per Week: 5 days    Minutes of Exercise per Session: 60 min  Stress: No Stress Concern Present (08/15/2022)   [employer] of Occupational Health - Occupational Stress Questionnaire    Feeling of Stress : Not at all  Social Connections: Socially Isolated (08/15/2022)   Social Connection and Isolation Panel    Frequency of Communication with Friends and Family: Once a week    Frequency of Social Gatherings with Friends and Family: Once a week    Attends Religious Services: Never    Database administrator or Organizations: No    Attends Engineer, structural: Not on file    Marital Status: Living with partner  Intimate Partner Violence: Unknown (04/09/2021)   Received from [facility]   HITS    Physically Hurt: Not on file    Insult or Talk Down To: Not on file    Threaten Physical Harm:  Not on file    Scream or Curse: Not on file    Outpatient Encounter Medications as of 10/21/2023  Medication Sig   allopurinol  (ZYLOPRIM ) 100 MG tablet Take 2 tablets by mouth daily. Please schedule an appointment for further refills.   Blood Glucose Monitoring Suppl (ONETOUCH VERIO FLEX SYSTEM) w/Device KIT Test BS TID w/ meals Dx E11.69   Cholecalciferol (VITAMIN D ) 50 MCG (2000 UT) tablet Take 1 tablet (2,000 Units total) by mouth daily.   Cholecalciferol (VITAMIN D3) 50 MCG (2000 UT) capsule Take 1 capsule (2,000 Units total) by mouth daily.   glucose blood (ONETOUCH VERIO) test strip Test BS TID w/ meals Dx E11.69   Lancets (ONETOUCH ULTRASOFT) lancets Test BS TID w/ meals Dx E11.69   losartan  (COZAAR ) 25 MG tablet Take 1 tablet (25 mg total) by mouth daily.   metFORMIN  (GLUCOPHAGE -XR) 500 MG 24 hr tablet Take 1 tablet (500 mg total) by mouth daily with breakfast.   naproxen sodium (ALEVE) 220 MG tablet Take 220 mg by mouth daily as needed.   rosuvastatin  (CRESTOR ) 10 MG tablet Take 1 tablet (10 mg total) by mouth daily.   No facility-administered encounter medications on file as of 10/21/2023.    No Known Allergies  Pertinent ROS per HPI, otherwise unremarkable      Objective:  There were no vitals taken for this visit.   Wt Readings from Last 3 Encounters:  09/08/23 281 lb 9.6 oz (127.7 kg)  05/15/23 285 lb (129.3 kg)  05/07/23 283 lb (128.4 kg)    Physical Exam Physical Exam      Results for orders placed or performed in visit on 09/08/23  Bayer DCA Hb A1c Waived   Collection Time: 09/08/23  8:05 AM  Result Value Ref Range   HB A1C (BAYER DCA - WAIVED) 5.1 4.8 - 5.6 %  VITAMIN D  25 Hydroxy (Vit-D Deficiency, Fractures)   Collection Time: 09/08/23  9:10 AM  Result Value Ref Range   Vit D, 25-Hydroxy 31.0 30.0 - 100.0 ng/mL       Pertinent labs & imaging results that were available during my care of the patient were reviewed by me and considered in my  medical decision making.  Assessment & Plan:  There are no diagnoses linked to this encounter.   Assessment and Plan Assessment & Plan       Continue all other maintenance medications.  Follow up plan: No follow-ups on file.   Continue healthy lifestyle choices, including diet (rich in fruits, vegetables, and lean proteins, and low  in salt and simple carbohydrates) and exercise (at least 30 minutes of moderate physical activity daily).  Educational handout given for ***  The above assessment and management plan was discussed with the patient. The patient verbalized understanding of and has agreed to the management plan. Patient is aware to call the clinic if they develop any new symptoms or if symptoms persist or worsen. Patient is aware when to return to the clinic for a follow-up visit. Patient educated on when it is appropriate to go to the emergency department.  Berlynn Warsame [place] Thompson, DNP [facility] [address] [place], [place] [address] [phone]

## 2023-10-21 ENCOUNTER — Ambulatory Visit (INDEPENDENT_AMBULATORY_CARE_PROVIDER_SITE_OTHER)

## 2023-10-21 ENCOUNTER — Ambulatory Visit (INDEPENDENT_AMBULATORY_CARE_PROVIDER_SITE_OTHER): Admitting: Nurse Practitioner

## 2023-10-21 VITALS — BP 128/74 | HR 75 | Temp 97.8°F | Ht 74.0 in | Wt 284.6 lb

## 2023-10-21 DIAGNOSIS — E559 Vitamin D deficiency, unspecified: Secondary | ICD-10-CM

## 2023-10-21 DIAGNOSIS — M25532 Pain in left wrist: Secondary | ICD-10-CM | POA: Diagnosis not present

## 2023-10-21 DIAGNOSIS — G5603 Carpal tunnel syndrome, bilateral upper limbs: Secondary | ICD-10-CM | POA: Diagnosis not present

## 2023-10-21 DIAGNOSIS — M25539 Pain in unspecified wrist: Secondary | ICD-10-CM | POA: Diagnosis not present

## 2023-10-21 DIAGNOSIS — M1A9XX0 Chronic gout, unspecified, without tophus (tophi): Secondary | ICD-10-CM

## 2023-10-21 DIAGNOSIS — E1169 Type 2 diabetes mellitus with other specified complication: Secondary | ICD-10-CM

## 2023-10-21 DIAGNOSIS — E1159 Type 2 diabetes mellitus with other circulatory complications: Secondary | ICD-10-CM

## 2023-10-26 ENCOUNTER — Ambulatory Visit: Payer: Self-pay | Admitting: Nurse Practitioner

## 2023-11-04 ENCOUNTER — Ambulatory Visit (INDEPENDENT_AMBULATORY_CARE_PROVIDER_SITE_OTHER): Admitting: Orthopedic Surgery

## 2023-11-04 ENCOUNTER — Encounter: Payer: Self-pay | Admitting: Orthopedic Surgery

## 2023-11-04 VITALS — BP 128/74 | Ht 74.0 in | Wt 284.0 lb

## 2023-11-04 DIAGNOSIS — R202 Paresthesia of skin: Secondary | ICD-10-CM | POA: Diagnosis not present

## 2023-11-04 NOTE — Progress Notes (Signed)
 New Patient Visit  Assessment: Erik Davis is a 43 y.o. male with the following: 1. Paresthesia of both hands  Plan: Jerona Hammersmith he has numbness and tingling in bilateral hands.  Presentation potentially consistent with carpal tunnel syndrome.  He has been dealing with this for years.  He has tried bracing.  He has had many injections of the carpal tunnel.  At this point, recommending bilateral EMGs for further evaluation.  Once these tests are available, he will return to clinic to discuss the findings.  Follow-up: Return for After EMG.  Subjective:  Chief Complaint  Patient presents with   Hand Problem    Both / wakes him up at night, has gotten injections but not helping any more     History of Present Illness: Erik Davis is a 43 y.o. male who has been referred by  for evaluation of bilateral hand pain.  He states he has had pain, numbness and tingling in bilateral hands for years.  He has tried braces.  He has had multiple injections.  The symptoms continue to bother him.  His hands go numb at work.  It wakes him up at night.  He is also complaining of numbness in the small finger.  He has not tried any medications.  He reports that he continues to have good strength.   Review of Systems: No fevers or chills + numbness or tingling No chest pain No shortness of breath No bowel or bladder dysfunction No GI distress No headaches   Medical History:  Past Medical History:  Diagnosis Date   Adult ADHD    managed by psychiatrist up until 09/2016   Bipolar affective disorder (HCC)    managed by psych up until 09/2016   Borderline hyperlipidemia 04/2017   Cellulitis of finger of right hand 11/05/2017   Cellulitis of thumb 11/06/2017   Diabetes mellitus (HCC) 2018   2018 HbA1c 5.9%.  Random gluc >200 and Hba1c 6.4% 02/2020.   Essential hypertension 2018   Fatty liver 2010   Noted on CT abd   Gout    two episodes1 yr apart--big toe.  Never been on preventative meds.    Nonspecific mesenteric adenitis 2010   Hx of   Obesity, Class II, BMI 35-39.9     Past Surgical History:  Procedure Laterality Date   CARDIOVASCULAR STRESS TEST  01/2021   EF 66%, no inducible ischemia.    Family History  Problem Relation Age of Onset   Mental retardation Father    Depression Maternal Grandmother    Cancer Paternal Grandmother        Lung?   Social History   Tobacco Use   Smoking status: Former    Current packs/day: 0.00    Average packs/day: 0.5 packs/day for 7.0 years (3.5 ttl pk-yrs)    Types: Cigarettes    Start date: 01/06/1997    Quit date: 01/07/2004    Years since quitting: 19.8   Smokeless tobacco: Never  Vaping Use   Vaping status: Never Used  Substance Use Topics   Alcohol use: Yes    Comment: rare - 2-3 beers a year   Drug use: No    No Known Allergies  Current Meds  Medication Sig   allopurinol  (ZYLOPRIM ) 100 MG tablet Take 2 tablets by mouth daily. Please schedule an appointment for further refills.   Blood Glucose Monitoring Suppl (ONETOUCH VERIO FLEX SYSTEM) w/Device KIT Test BS TID w/ meals Dx E11.69   Cholecalciferol (VITAMIN D ) 50 MCG (  2000 UT) tablet Take 1 tablet (2,000 Units total) by mouth daily.   Cholecalciferol (VITAMIN D3) 50 MCG (2000 UT) capsule Take 1 capsule (2,000 Units total) by mouth daily.   glucose blood (ONETOUCH VERIO) test strip Test BS TID w/ meals Dx E11.69   Lancets (ONETOUCH ULTRASOFT) lancets Test BS TID w/ meals Dx E11.69   losartan  (COZAAR ) 25 MG tablet Take 1 tablet (25 mg total) by mouth daily.   metFORMIN  (GLUCOPHAGE -XR) 500 MG 24 hr tablet Take 1 tablet (500 mg total) by mouth daily with breakfast.   naproxen sodium (ALEVE) 220 MG tablet Take 220 mg by mouth daily as needed.   rosuvastatin  (CRESTOR ) 10 MG tablet Take 1 tablet (10 mg total) by mouth daily.    Objective: BP 128/74 Comment: 10/21/23  Ht 6' 2 (1.88 m)   Wt 284 lb (128.8 kg)   BMI 36.46 kg/m   Physical Exam:  General: Alert and  oriented. and No acute distress. Gait: Normal gait.  Bilateral hands without deformity.  Decree sensation in the small finger bilaterally.  Negative Tinel's.  Positive Phalen's.  Positive carpal tunnel compression.  No atrophy is appreciated in bilateral hands.  IMAGING: No new imaging obtained today   New Medications:  No orders of the defined types were placed in this encounter.     [name], MD  11/04/2023 1:36 PM

## 2023-11-04 NOTE — Patient Instructions (Signed)
 We are referring you to [facility] from [facility] address is [address] [place] The phone number is [phone]  The office will call you with an appointment Dr. Alvester Morin

## 2023-11-18 ENCOUNTER — Ambulatory Visit: Admitting: Physical Medicine and Rehabilitation

## 2023-11-18 ENCOUNTER — Encounter: Payer: Self-pay | Admitting: Physical Medicine and Rehabilitation

## 2023-11-18 DIAGNOSIS — R202 Paresthesia of skin: Secondary | ICD-10-CM | POA: Diagnosis not present

## 2023-11-18 NOTE — Progress Notes (Signed)
 Pain Scale   Average Pain 0 Patient advising he has bilateral numbness and tingling to hands and occ. pain Patient is Right hand dominate       +Driver, -BT, -Dye Allergies.

## 2023-11-19 NOTE — Procedures (Signed)
 EMG & NCV Findings: Evaluation of the left median motor and the right median motor nerves showed prolonged distal onset latency (L6.2, R6.3 ms) and decreased conduction velocity (Elbow-Wrist, L47, R46 m/s).  The left median (across palm) sensory and the right median (across palm) sensory nerves showed prolonged distal peak latency (Wrist, L6.5, R12.4 ms), reduced amplitude (L7.6, R2.9 V), and prolonged distal peak latency (Palm, L4.1, R8.4 ms).  All remaining nerves (as indicated in the following tables) were within normal limits.  All left vs. right side differences were within normal limits.    All examined muscles (as indicated in the following table) showed no evidence of electrical instability.    Impression: The above electrodiagnostic study is ABNORMAL and reveals evidence of a moderate to severe bilateral median nerve entrapment at the wrist (carpal tunnel syndrome) affecting sensory and motor components.   There is no significant electrodiagnostic evidence of any other focal nerve entrapment, brachial plexopathy or cervical radiculopathy.   Recommendations: 1.  Follow-up with referring physician. 2.  Continue current management of symptoms. 3.  Suggest surgical evaluation.  ___________________________ Prentice Masters FAAPMR Board Certified, American Board of Physical Medicine and Rehabilitation    Nerve Conduction Studies Anti Sensory Summary Table   Stim Site NR Peak (ms) Norm Peak (ms) P-T Amp (V) Norm P-T Amp Site1 Site2 Delta-P (ms) Dist (cm) Vel (m/s) Norm Vel (m/s)  Left Median Acr Palm Anti Sensory (2nd Digit)  30.6C  Wrist    *6.5 <3.6 *7.6 >10 Wrist Palm 2.4 0.0    Palm    *4.1 <2.0 2.3         Right Median Acr Palm Anti Sensory (2nd Digit)  29.8C  Wrist    *12.4 <3.6 *2.9 >10 Wrist Palm 4.0 0.0    Palm    *8.4 <2.0 7.3         Right Radial Anti Sensory (Base 1st Digit)  30C  Wrist    2.3 <3.1 30.5  Wrist Base 1st Digit 2.3 0.0    Right Ulnar Anti Sensory (5th  Digit)  30.4C  Wrist    3.2 <3.7 23.9 >15.0 Wrist 5th Digit 3.2 14.0 44 >38   Motor Summary Table   Stim Site NR Onset (ms) Norm Onset (ms) O-P Amp (mV) Norm O-P Amp Site1 Site2 Delta-0 (ms) Dist (cm) Vel (m/s) Norm Vel (m/s)  Left Median Motor (Abd Poll Brev)  30.9C  Wrist    *6.2 <4.2 7.2 >5 Elbow Wrist 5.3 25.0 *47 >50  Elbow    11.5  4.0         Right Median Motor (Abd Poll Brev)  30.2C  Wrist    *6.3 <4.2 7.7 >5 Elbow Wrist 5.3 24.5 *46 >50  Elbow    11.6  7.4         Right Ulnar Motor (Abd Dig Min)  30.3C  Wrist    2.9 <4.2 12.0 >3 B Elbow Wrist 4.0 25.5 64 >53  B Elbow    6.9  11.0  A Elbow B Elbow 1.1 11.0 100 >53  A Elbow    8.0  10.8          EMG   Side Muscle Nerve Root Ins Act Fibs Psw Amp Dur Poly Recrt Int Bruna Comment  Right Abd Poll Brev Median C8-T1 Nml Nml Nml Nml Nml 0 Nml Nml   Right 1stDorInt Ulnar C8-T1 Nml Nml Nml Nml Nml 0 Nml Nml   Right PronatorTeres Median C6-7 Nml Nml Nml Nml  Nml 0 Nml Nml   Right Biceps Musculocut C5-6 Nml Nml Nml Nml Nml 0 Nml Nml   Right Deltoid Axillary C5-6 Nml Nml Nml Nml Nml 0 Nml Nml     Nerve Conduction Studies Anti Sensory Left/Right Comparison   Stim Site L Lat (ms) R Lat (ms) L-R Lat (ms) L Amp (V) R Amp (V) L-R Amp (%) Site1 Site2 L Vel (m/s) R Vel (m/s) L-R Vel (m/s)  Median Acr Palm Anti Sensory (2nd Digit)  30.6C  Wrist *6.5 *12.4 5.9 *7.6 *2.9 61.8 Wrist Palm     Palm *4.1 *8.4 4.3 2.3 7.3 68.5       Radial Anti Sensory (Base 1st Digit)  30C  Wrist  2.3   30.5  Wrist Base 1st Digit     Ulnar Anti Sensory (5th Digit)  30.4C  Wrist  3.2   23.9  Wrist 5th Digit  44    Motor Left/Right Comparison   Stim Site L Lat (ms) R Lat (ms) L-R Lat (ms) L Amp (mV) R Amp (mV) L-R Amp (%) Site1 Site2 L Vel (m/s) R Vel (m/s) L-R Vel (m/s)  Median Motor (Abd Poll Brev)  30.9C  Wrist *6.2 *6.3 0.1 7.2 7.7 6.5 Elbow Wrist *47 *46 1  Elbow 11.5 11.6 0.1 4.0 7.4 45.9       Ulnar Motor (Abd Dig Min)  30.3C  Wrist  2.9    12.0  B Elbow Wrist  64   B Elbow  6.9   11.0  A Elbow B Elbow  100   A Elbow  8.0   10.8           Waveforms:

## 2023-11-28 NOTE — Progress Notes (Signed)
 Erik Davis - 43 y.o. male MRN [id number]  Date of birth: 09/18/1980  Office Visit Note: Visit Date: 11/18/2023 PCP: Deitra Morton Sebastian Nena, NP Referred by: Onesimo Oneil LABOR, MD  Subjective: Chief Complaint  Patient presents with   Right Hand - Numbness   Left Hand - Numbness   HPI:  Erik Davis is a 43 y.o. male who comes in today at the request of Dr. Oneil Onesimo for evaluation and management of chronic, worsening and severe pain, numbness and tingling in the Bilateral upper extremities.  Patient is Right hand dominant.  Several years of bilateral hand pain numbness and tingling somewhat equally.  No history of electrodiagnostic study but has had diagnostic and therapeutic carpal tunnel injections that have helped over the years.  Has had bracing and anti-inflammatories and this just is not working at this point.  He is diabetic and his most recent hemoglobin A1c that I can see is normal but he did have as high as 11 in the past year.   ROS Otherwise per HPI.  Assessment & Plan: Visit Diagnoses:    ICD-10-CM   1. Paresthesia of skin  R20.2 NCV with EMG (electromyography)      Plan: Impression: The above electrodiagnostic study is ABNORMAL and reveals evidence of a moderate to severe bilateral median nerve entrapment at the wrist (carpal tunnel syndrome) affecting sensory and motor components.   There is no significant electrodiagnostic evidence of any other focal nerve entrapment, brachial plexopathy or cervical radiculopathy.   Recommendations: 1.  Follow-up with referring physician. 2.  Continue current management of symptoms. 3.  Suggest surgical evaluation.  Meds & Orders: No orders of the defined types were placed in this encounter.   Orders Placed This Encounter  Procedures   NCV with EMG (electromyography)    Follow-up: Return for Oneil Onesimo, MD.   Procedures: No procedures performed  EMG & NCV Findings: Evaluation of the left median motor and the right median  motor nerves showed prolonged distal onset latency (L6.2, R6.3 ms) and decreased conduction velocity (Elbow-Wrist, L47, R46 m/s).  The left median (across palm) sensory and the right median (across palm) sensory nerves showed prolonged distal peak latency (Wrist, L6.5, R12.4 ms), reduced amplitude (L7.6, R2.9 V), and prolonged distal peak latency (Palm, L4.1, R8.4 ms).  All remaining nerves (as indicated in the following tables) were within normal limits.  All left vs. right side differences were within normal limits.    All examined muscles (as indicated in the following table) showed no evidence of electrical instability.    Impression: The above electrodiagnostic study is ABNORMAL and reveals evidence of a moderate to severe bilateral median nerve entrapment at the wrist (carpal tunnel syndrome) affecting sensory and motor components.   There is no significant electrodiagnostic evidence of any other focal nerve entrapment, brachial plexopathy or cervical radiculopathy.   Recommendations: 1.  Follow-up with referring physician. 2.  Continue current management of symptoms. 3.  Suggest surgical evaluation.  ___________________________ Prentice Masters FAAPMR Board Certified, American Board of Physical Medicine and Rehabilitation    Nerve Conduction Studies Anti Sensory Summary Table   Stim Site NR Peak (ms) Norm Peak (ms) P-T Amp (V) Norm P-T Amp Site1 Site2 Delta-P (ms) Dist (cm) Vel (m/s) Norm Vel (m/s)  Left Median Acr Palm Anti Sensory (2nd Digit)  30.6C  Wrist    *6.5 <3.6 *7.6 >10 Wrist Palm 2.4 0.0    Palm    *4.1 <2.0 2.3  Right Median Acr Palm Anti Sensory (2nd Digit)  29.8C  Wrist    *12.4 <3.6 *2.9 >10 Wrist Palm 4.0 0.0    Palm    *8.4 <2.0 7.3         Right Radial Anti Sensory (Base 1st Digit)  30C  Wrist    2.3 <3.1 30.5  Wrist Base 1st Digit 2.3 0.0    Right Ulnar Anti Sensory (5th Digit)  30.4C  Wrist    3.2 <3.7 23.9 >15.0 Wrist 5th Digit 3.2 14.0 44 >38    Motor Summary Table   Stim Site NR Onset (ms) Norm Onset (ms) O-P Amp (mV) Norm O-P Amp Site1 Site2 Delta-0 (ms) Dist (cm) Vel (m/s) Norm Vel (m/s)  Left Median Motor (Abd Poll Brev)  30.9C  Wrist    *6.2 <4.2 7.2 >5 Elbow Wrist 5.3 25.0 *47 >50  Elbow    11.5  4.0         Right Median Motor (Abd Poll Brev)  30.2C  Wrist    *6.3 <4.2 7.7 >5 Elbow Wrist 5.3 24.5 *46 >50  Elbow    11.6  7.4         Right Ulnar Motor (Abd Dig Min)  30.3C  Wrist    2.9 <4.2 12.0 >3 B Elbow Wrist 4.0 25.5 64 >53  B Elbow    6.9  11.0  A Elbow B Elbow 1.1 11.0 100 >53  A Elbow    8.0  10.8          EMG   Side Muscle Nerve Root Ins Act Fibs Psw Amp Dur Poly Recrt Int Bruna Comment  Right Abd Poll Brev Median C8-T1 Nml Nml Nml Nml Nml 0 Nml Nml   Right 1stDorInt Ulnar C8-T1 Nml Nml Nml Nml Nml 0 Nml Nml   Right PronatorTeres Median C6-7 Nml Nml Nml Nml Nml 0 Nml Nml   Right Biceps Musculocut C5-6 Nml Nml Nml Nml Nml 0 Nml Nml   Right Deltoid Axillary C5-6 Nml Nml Nml Nml Nml 0 Nml Nml     Nerve Conduction Studies Anti Sensory Left/Right Comparison   Stim Site L Lat (ms) R Lat (ms) L-R Lat (ms) L Amp (V) R Amp (V) L-R Amp (%) Site1 Site2 L Vel (m/s) R Vel (m/s) L-R Vel (m/s)  Median Acr Palm Anti Sensory (2nd Digit)  30.6C  Wrist *6.5 *12.4 5.9 *7.6 *2.9 61.8 Wrist Palm     Palm *4.1 *8.4 4.3 2.3 7.3 68.5       Radial Anti Sensory (Base 1st Digit)  30C  Wrist  2.3   30.5  Wrist Base 1st Digit     Ulnar Anti Sensory (5th Digit)  30.4C  Wrist  3.2   23.9  Wrist 5th Digit  44    Motor Left/Right Comparison   Stim Site L Lat (ms) R Lat (ms) L-R Lat (ms) L Amp (mV) R Amp (mV) L-R Amp (%) Site1 Site2 L Vel (m/s) R Vel (m/s) L-R Vel (m/s)  Median Motor (Abd Poll Brev)  30.9C  Wrist *6.2 *6.3 0.1 7.2 7.7 6.5 Elbow Wrist *47 *46 1  Elbow 11.5 11.6 0.1 4.0 7.4 45.9       Ulnar Motor (Abd Dig Min)  30.3C  Wrist  2.9   12.0  B Elbow Wrist  64   B Elbow  6.9   11.0  A Elbow B Elbow  100   A Elbow   8.0   10.8  Waveforms:                Clinical History: No specialty comments available.     Objective:  VS:  HT:    WT:   BMI:     BP:   HR: bpm  TEMP: ( )  RESP:  Physical Exam   Imaging: No results found.

## 2023-12-02 ENCOUNTER — Ambulatory Visit: Admitting: Orthopedic Surgery

## 2023-12-02 ENCOUNTER — Encounter: Payer: Self-pay | Admitting: Orthopedic Surgery

## 2023-12-02 VITALS — Ht 74.0 in | Wt 284.0 lb

## 2023-12-02 DIAGNOSIS — G5602 Carpal tunnel syndrome, left upper limb: Secondary | ICD-10-CM

## 2023-12-02 DIAGNOSIS — G5601 Carpal tunnel syndrome, right upper limb: Secondary | ICD-10-CM | POA: Diagnosis not present

## 2023-12-02 NOTE — Progress Notes (Signed)
 Return Patient Visit  Assessment: Erik Davis is a 43 y.o. male with the following: 1. Paresthesia of both hands  Plan: Erik Davis he has moderate to severe carpal tunnel syndrome in bilateral hands.  He has been dealing with this for several years.  He has done well with injections, but the efficacy is waning.  He is interested in surgery.  Procedure was discussed in detail.  All questions have been answered.  He would like to proceed with surgery on his right hand.  We will proceed with open right carpal tunnel release on December 21, 2023.  Risks and benefits of the surgery, including, but not limited to infection, bleeding, persistent pain, need for further surgery and more severe complications associated with anesthesia were discussed with the patient.  The patient has elected to proceed.    Follow-up: Return for After surgery.  Subjective:  Chief Complaint  Patient presents with   Results    Review NCV results     History of Present Illness: Erik Davis is a 44 y.o. male who returns to clinic for repeat evaluation of bilateral hand pain.  I saw him in clinic and sent him for EMGs.  Results have been completed.  He continues to have numbness and tingling in both hands.  Both hands are roughly equal.  He is right-hand dominant.  He has been dealing with this for 15 years.  He has had a number of injections.   Review of Systems: No fevers or chills + numbness or tingling No chest pain No shortness of breath No bowel or bladder dysfunction No GI distress No headaches   Objective: Ht 6' 2 (1.88 m)   Wt 284 lb (128.8 kg)   BMI 36.46 kg/m   Physical Exam:  General: Alert and oriented. and No acute distress. Gait: Normal gait.  Bilateral hands without deformity.  Negative Tinel's.  Positive Phalen's.  Positive carpal tunnel compression.  No atrophy is appreciated in bilateral hands.  IMAGING: No new imaging obtained today   EMG results available in clinic today.   Moderate to severe carpal tunnel syndrome bilaterally.  New Medications:  No orders of the defined types were placed in this encounter.     Erik Davis DELENA Horde, MD  12/02/2023 8:59 AM

## 2023-12-02 NOTE — Patient Instructions (Signed)
 Preoperative Instructions  Your surgery will be at [facility], scheduled with Dr Oneil Horde   The hospital will contact you with a preoperative appointment to discuss Anesthesia. The phone number is [phone]   Please bring your medications with you for the appointment.  They will tell you the arrival time and medication instructions when you have your preoperative evaluation.  Do not wear nail polish the day of your surgery and if you take Phentermine you need to stop this medication ONE WEEK prior to your surgery.    If you take an blood thinning medication, we will need to stop this prior to surgery.  Typically, we stop this medicine at least 5 days prior to surgery.  We will need to confirm this with the doctor who prescribes this medication.  If you are taking medications or an injection for diabetes, or for weight management, this medicine will need to be stopped at least 7 days prior to surgery.     Surgery will be scheduled for 12/21/23 pending authorization by your insurance company.   Pain medicine policy:  Per [facility] clinic policy, our goal is ensure optimal postoperative pain control with a multimodal pain management strategy.   For all [facility] patients, our goal is to wean post-operative narcotic medications by 6 weeks post-operatively.   If this is not possible due to utilization of pain medication prior to surgery, your [facility] doctor will support your acute post-operative pain control for the first 6 weeks postoperatively, with a plan to transition you back to your primary pain team following that.   [facility] will work to ensure a therapist, occupational.

## 2023-12-02 NOTE — H&P (View-Only) (Signed)
 Return Patient Visit  Assessment: Erik Davis is a 43 y.o. male with the following: 1. Paresthesia of both hands  Plan: Erik Davis he has moderate to severe carpal tunnel syndrome in bilateral hands.  He has been dealing with this for several years.  He has done well with injections, but the efficacy is waning.  He is interested in surgery.  Procedure was discussed in detail.  All questions have been answered.  He would like to proceed with surgery on his right hand.  We will proceed with open right carpal tunnel release on December 21, 2023.  Risks and benefits of the surgery, including, but not limited to infection, bleeding, persistent pain, need for further surgery and more severe complications associated with anesthesia were discussed with the patient.  The patient has elected to proceed.    Follow-up: Return for After surgery.  Subjective:  Chief Complaint  Patient presents with   Results    Review NCV results     History of Present Illness: Erik Davis is a 44 y.o. male who returns to clinic for repeat evaluation of bilateral hand pain.  I saw him in clinic and sent him for EMGs.  Results have been completed.  He continues to have numbness and tingling in both hands.  Both hands are roughly equal.  He is right-hand dominant.  He has been dealing with this for 15 years.  He has had a number of injections.   Review of Systems: No fevers or chills + numbness or tingling No chest pain No shortness of breath No bowel or bladder dysfunction No GI distress No headaches   Objective: Ht 6' 2 (1.88 m)   Wt 284 lb (128.8 kg)   BMI 36.46 kg/m   Physical Exam:  General: Alert and oriented. and No acute distress. Gait: Normal gait.  Bilateral hands without deformity.  Negative Tinel's.  Positive Phalen's.  Positive carpal tunnel compression.  No atrophy is appreciated in bilateral hands.  IMAGING: No new imaging obtained today   EMG results available in clinic today.   Moderate to severe carpal tunnel syndrome bilaterally.  New Medications:  No orders of the defined types were placed in this encounter.     [name], MD  12/02/2023 8:59 AM

## 2023-12-08 ENCOUNTER — Ambulatory Visit: Payer: Self-pay | Admitting: Nurse Practitioner

## 2023-12-08 ENCOUNTER — Encounter: Payer: Self-pay | Admitting: Nurse Practitioner

## 2023-12-08 ENCOUNTER — Ambulatory Visit: Payer: Self-pay | Admitting: Orthopedic Surgery

## 2023-12-08 VITALS — BP 115/72 | HR 79 | Temp 98.6°F | Ht 74.0 in | Wt 296.0 lb

## 2023-12-08 DIAGNOSIS — E785 Hyperlipidemia, unspecified: Secondary | ICD-10-CM | POA: Diagnosis not present

## 2023-12-08 DIAGNOSIS — E1159 Type 2 diabetes mellitus with other circulatory complications: Secondary | ICD-10-CM

## 2023-12-08 DIAGNOSIS — E66812 Obesity, class 2: Secondary | ICD-10-CM | POA: Insufficient documentation

## 2023-12-08 DIAGNOSIS — E559 Vitamin D deficiency, unspecified: Secondary | ICD-10-CM | POA: Diagnosis not present

## 2023-12-08 DIAGNOSIS — Z6838 Body mass index (BMI) 38.0-38.9, adult: Secondary | ICD-10-CM | POA: Diagnosis not present

## 2023-12-08 DIAGNOSIS — I152 Hypertension secondary to endocrine disorders: Secondary | ICD-10-CM | POA: Diagnosis not present

## 2023-12-08 DIAGNOSIS — Z7984 Long term (current) use of oral hypoglycemic drugs: Secondary | ICD-10-CM | POA: Diagnosis not present

## 2023-12-08 DIAGNOSIS — Z7985 Long-term (current) use of injectable non-insulin antidiabetic drugs: Secondary | ICD-10-CM | POA: Diagnosis not present

## 2023-12-08 DIAGNOSIS — M1A9XX0 Chronic gout, unspecified, without tophus (tophi): Secondary | ICD-10-CM | POA: Diagnosis not present

## 2023-12-08 DIAGNOSIS — E1169 Type 2 diabetes mellitus with other specified complication: Secondary | ICD-10-CM | POA: Diagnosis not present

## 2023-12-08 LAB — BAYER DCA HB A1C WAIVED: HB A1C (BAYER DCA - WAIVED): 6.3 % — ABNORMAL HIGH (ref 4.8–5.6)

## 2023-12-08 MED ORDER — OZEMPIC (0.25 OR 0.5 MG/DOSE) 2 MG/1.5ML ~~LOC~~ SOPN: 0.5000 mg | PEN_INJECTOR | SUBCUTANEOUS | 0 refills | Status: DC

## 2023-12-08 MED ORDER — METFORMIN HCL ER 500 MG PO TB24: 500.0000 mg | ORAL_TABLET | Freq: Every day | ORAL | 0 refills | Status: AC

## 2023-12-08 NOTE — Progress Notes (Addendum)
 Subjective:  Patient ID: Erik Davis, male    DOB: 07/25/1980, 43 y.o.   MRN: [id number]  Patient Care Team: Deitra Morton Sebastian Nena, NP as PCP - General (Nurse Practitioner) Ladora Ross Lacy Phebe, MD as Referring Physician [facility])   Chief Complaint:  Medical Management of Chronic Issues   HPI: Erik Davis is a 43 y.o. male presenting on 12/08/2023 for Medical Management of Chronic Issues   Discussed the use of AI scribe software for clinical note transcription with the patient, who gave verbal consent to proceed.  History of Present Illness Erik Davis is a 43 year old male who presents for a three month follow-up for chronic disease management.  He is currently on metformin  500 mg daily for type 2 diabetes, which is well controlled with a last A1c of 5.1% from September 08, 2023. He reports no issues with blood sugar levels at home. A1c today 6.3 % He is seeking a refill for metformin  as he has a few left,  He takes losartan  25 mg daily for hypertension. No side effects such as dizziness, headache, or shortness of breath.  He takes Crsestor 10 mg daily for hyperlipidemia. There are no new concerns reported.  He takes a vitamin D  supplement of 2000 units daily and denies any unexplained fatigue or tiredness.  He has a history of gout and takes allopurinol  100 mg daily. He reports no gout attacks in the past two years.  He has been attempting weight loss through diet and exercise, though he notes having plateaued. He was previously on Trulicity  for weight management, but insurance coverage issues led to discontinuation.       Relevant past medical, surgical, family, and social history reviewed and updated as indicated.  Allergies and medications reviewed and updated. Data reviewed: Chart in Epic.   Past Medical History:  Diagnosis Date   Adult ADHD    managed by psychiatrist up until 09/2016   Bipolar affective disorder (HCC)    managed by psych up until 09/2016    Borderline hyperlipidemia 04/2017   Cellulitis of finger of right hand 11/05/2017   Cellulitis of thumb 11/06/2017   Diabetes mellitus (HCC) 2018   2018 HbA1c 5.9%.  Random gluc >200 and Hba1c 6.4% 02/2020.   Essential hypertension 2018   Fatty liver 2010   Noted on CT abd   GERD (gastroesophageal reflux disease)    Gout    two episodes1 yr apart--big toe.  Never been on preventative meds.   Nonspecific mesenteric adenitis 2010   Hx of   Obesity, Class II, BMI 35-39.9     Past Surgical History:  Procedure Laterality Date   CARDIOVASCULAR STRESS TEST  01/2021   EF 66%, no inducible ischemia.    Social History   Socioeconomic History   Marital status: Married    Spouse name: Not on file   Number of children: Not on file   Years of education: Not on file   Highest education level: 12th grade  Occupational History   Not on file  Tobacco Use   Smoking status: Former    Current packs/day: 0.00    Average packs/day: 0.5 packs/day for 7.0 years (3.5 ttl pk-yrs)    Types: Cigarettes    Start date: 01/06/1997    Quit date: 01/07/2004    Years since quitting: 19.9   Smokeless tobacco: Never  Vaping Use   Vaping status: Never Used  Substance and Sexual Activity   Alcohol use: Yes  Comment: rare - 2-3 beers a year   Drug use: No   Sexual activity: Not on file  Other Topics Concern   Not on file  Social History Narrative   Single, two teenage sons.   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   No current tobacco: 5 yr pack hx--quit 2006.   No alcohol.   No hx of prob with alc or drugs.   Social Drivers of Corporate Investment Banker Strain: Low Risk  (10/21/2023)   Overall Financial Resource Strain (CARDIA)    Difficulty of Paying Living Expenses: Not hard at all  Food Insecurity: No Food Insecurity (10/21/2023)   Hunger Vital Sign    Worried About Running Out of Food in the Last Year: Never true    Ran Out of Food in the Last Year: Never true   Transportation Needs: No Transportation Needs (10/21/2023)   PRAPARE - Administrator, Civil Service (Medical): No    Lack of Transportation (Non-Medical): No  Physical Activity: Unknown (10/21/2023)   Exercise Vital Sign    Days of Exercise per Week: 3 days    Minutes of Exercise per Session: Not on file  Stress: No Stress Concern Present (10/21/2023)   [employer] of Occupational Health - Occupational Stress Questionnaire    Feeling of Stress: Not at all  Social Connections: Moderately Isolated (10/21/2023)   Social Connection and Isolation Panel    Frequency of Communication with Friends and Family: Three times a week    Frequency of Social Gatherings with Friends and Family: Twice a week    Attends Religious Services: Never    Database Administrator or Organizations: No    Attends Engineer, Structural: Not on file    Marital Status: Married  Catering Manager Violence: Not At Risk (12/08/2023)   Humiliation, Afraid, Rape, and Kick questionnaire    Fear of Current or Ex-Partner: No    Emotionally Abused: No    Physically Abused: No    Sexually Abused: No    Outpatient Encounter Medications as of 12/08/2023  Medication Sig   allopurinol  (ZYLOPRIM ) 100 MG tablet Take 2 tablets by mouth daily. Please schedule an appointment for further refills.   Blood Glucose Monitoring Suppl (ONETOUCH VERIO FLEX SYSTEM) w/Device KIT Test BS TID w/ meals Dx E11.69   Cholecalciferol (VITAMIN D ) 50 MCG (2000 UT) tablet Take 1 tablet (2,000 Units total) by mouth daily.   Cholecalciferol (VITAMIN D3) 50 MCG (2000 UT) capsule Take 1 capsule (2,000 Units total) by mouth daily.   glucose blood (ONETOUCH VERIO) test strip Test BS TID w/ meals Dx E11.69   Lancets (ONETOUCH ULTRASOFT) lancets Test BS TID w/ meals Dx E11.69   losartan  (COZAAR ) 25 MG tablet Take 1 tablet (25 mg total) by mouth daily.   naproxen sodium (ALEVE) 220 MG tablet Take 220 mg by mouth daily as needed.    rosuvastatin  (CRESTOR ) 10 MG tablet Take 1 tablet (10 mg total) by mouth daily.   Semaglutide ,0.25 or 0.5MG /DOS, (OZEMPIC , 0.25 OR 0.5 MG/DOSE,) 2 MG/1.5ML SOPN Inject 0.5 mg into the skin every 7 (seven) days.   [DISCONTINUED] metFORMIN  (GLUCOPHAGE -XR) 500 MG 24 hr tablet Take 1 tablet (500 mg total) by mouth daily with breakfast.   metFORMIN  (GLUCOPHAGE -XR) 500 MG 24 hr tablet Take 1 tablet (500 mg total) by mouth daily with breakfast.   No facility-administered encounter medications on file as of 12/08/2023.    No Known Allergies  Pertinent ROS per HPI, otherwise unremarkable      Objective:  BP 115/72   Pulse 79   Temp 98.6 F (37 C)   Ht 6' 2 (1.88 m)   Wt 296 lb (134.3 kg)   SpO2 96%   BMI 38.00 kg/m    Wt Readings from Last 3 Encounters:  12/08/23 296 lb (134.3 kg)  12/02/23 284 lb (128.8 kg)  11/04/23 284 lb (128.8 kg)    Physical Exam Vitals and nursing note reviewed.  Constitutional:      Appearance: He is obese.  HENT:     Head: Normocephalic and atraumatic.     Nose: Nose normal.     Mouth/Throat:     Mouth: Mucous membranes are moist.  Eyes:     General: No scleral icterus.    Extraocular Movements: Extraocular movements intact.     Conjunctiva/sclera: Conjunctivae normal.     Pupils: Pupils are equal, round, and reactive to light.  Cardiovascular:     Heart sounds: Normal heart sounds.  Pulmonary:     Effort: Pulmonary effort is normal.     Breath sounds: Normal breath sounds.  Musculoskeletal:        General: Normal range of motion.     Right lower leg: No edema.     Left lower leg: No edema.  Skin:    General: Skin is warm and dry.     Findings: No rash.  Neurological:     Mental Status: He is alert and oriented to person, place, and time.  Psychiatric:        Mood and Affect: Mood normal.        Behavior: Behavior normal.        Thought Content: Thought content normal.        Judgment: Judgment normal.    Physical Exam MEASUREMENTS:  BMI- 38.0.     Results for orders placed or performed in visit on 09/08/23  Bayer DCA Hb A1c Waived   Collection Time: 09/08/23  8:05 AM  Result Value Ref Range   HB A1C (BAYER DCA - WAIVED) 5.1 4.8 - 5.6 %  VITAMIN D  25 Hydroxy (Vit-D Deficiency, Fractures)   Collection Time: 09/08/23  9:10 AM  Result Value Ref Range   Vit D, 25-Hydroxy 31.0 30.0 - 100.0 ng/mL       Pertinent labs & imaging results that were available during my care of the patient were reviewed by me and considered in my medical decision making.  Assessment & Plan:  Erik Davis was seen today for medical management of chronic issues.  Diagnoses and all orders for this visit:  Class 2 severe obesity due to excess calories with serious comorbidity and body mass index (BMI) of 38.0 to 38.9 in adult -     Semaglutide ,0.25 or 0.5MG /DOS, (OZEMPIC , 0.25 OR 0.5 MG/DOSE,) 2 MG/1.5ML SOPN; Inject 0.5 mg into the skin every 7 (seven) days. -     metFORMIN  (GLUCOPHAGE -XR) 500 MG 24 hr tablet; Take 1 tablet (500 mg total) by mouth daily with breakfast. -     Bayer DCA Hb A1c Waived  Hypertension associated with type 2 diabetes mellitus (HCC) -     Semaglutide ,0.25 or 0.5MG /DOS, (OZEMPIC , 0.25 OR 0.5 MG/DOSE,) 2 MG/1.5ML SOPN; Inject 0.5 mg into the skin every 7 (seven) days.  Hyperlipidemia associated with type 2 diabetes mellitus (HCC) -     Semaglutide ,0.25 or 0.5MG /DOS, (OZEMPIC , 0.25 OR 0.5 MG/DOSE,) 2 MG/1.5ML SOPN; Inject 0.5 mg into the skin  every 7 (seven) days.  Chronic gout without tophus, unspecified cause, unspecified site  Vitamin D  deficiency  Type 2 diabetes mellitus with other specified complication, without long-term current use of insulin (HCC) -     Semaglutide ,0.25 or 0.5MG /DOS, (OZEMPIC , 0.25 OR 0.5 MG/DOSE,) 2 MG/1.5ML SOPN; Inject 0.5 mg into the skin every 7 (seven) days. -     metFORMIN  (GLUCOPHAGE -XR) 500 MG 24 hr tablet; Take 1 tablet (500 mg total) by mouth daily with breakfast. -     Bayer  DCA Hb A1c Waived     Assessment and Plan Erik Davis is a 43 year old Caucasian male seen today for chronic disease and, no acute distress Assessment & Plan Type 2 diabetes mellitus, well controlled Diabetes well controlled with A1c of 5.1%. Current metformin  therapy effective. Ozempic  considered for additional control and weight loss, pending insurance approval. Regular eye exams advised due to rare blindness risk with Ozempic . - Continue metformin  500 mg daily. - Attempted to obtain insurance approval for Ozempic . - If approved, initiate low dose for four weeks, then increase. - Schedule follow-up in one month if approved to assess response and adjust dosage. - If not approved, schedule follow-up in three months. - Advised eyesight checks twice a year.   Obesity, class 2 BMI 38.0. Weight loss plateaued with diet and exercise. Ozempic  considered for weight loss, pending insurance approval. - Attempted to obtain insurance approval for Ozempic . - Encouraged continued diet and exercise efforts.  Hypertension Well managed with losartan  25 mg daily. No side effects reported. - Continue losartan  25 mg daily.  Vitamin D  deficiency Managed with cholecalciferol 2000 IU daily. No symptoms reported. - Continue cholecalciferol 2000 IU daily.  Chronic gout, without tophus Well-managed with allopurinol  100 mg daily. No attacks in two years.   Labs: A1c 6.3%  Continue all other maintenance medications.   Follow up plan: Return in about 1 month (around 01/08/2024) for Ozempic  dose titration.   Continue healthy lifestyle choices, including diet (rich in fruits, vegetables, and lean proteins, and low in salt and simple carbohydrates) and exercise (at least 30 minutes of moderate physical activity daily).  Educational handout given for   Clinical References  Low-Purine Diet A low-purine diet involves making choices to limit how much purine you take in. Purine is a kind of uric acid. If you  have too much uric acid in your blood, it can cause problems like gout and kidney stones. Eating a low-purine diet can help control those problems. What are tips for following this plan? Shopping Avoid buying things that have high-fructose corn syrup in them. Check for this on food labels. Be sure to check for it in: [employer], such as cookies. Canned fruits. Cereals and cereal bars. Avoid buying meats with a high purine content. These include: Veal. Chicken breast with skin. Lamb. Organ meats, such as liver. Choose dairy products. These may lower uric acid levels. Avoid buying certain types of fish. These include: Anchovies. Trout. Tuna. Sardines. Salmon. Avoid buying drinks with alcohol in them. Alcohol can affect the way your body gets rid of uric acid. Stay away from: [facility]. Hard liquor. Meal planning  Learn which foods do or don't affect you. If you find a food that causes problems, avoid eating that food. If you have any questions about a food item, talk with your health care provider. Eat less meat. When you do eat meat, choose meats that have less purine in them. Eat lots of fruits and vegetables. Even though some vegetables have  more purine in them, they don't add as much to the amount of uric acid in your blood as other foods. Have at least one serving of dairy a day. General information If you drink alcohol: Limit how much you have to: 0-1 drink a day if you're male. 0-2 drinks a day if you're male. Know how much alcohol is in your drink. In the [place] one drink is one 12 oz bottle of beer (355 mL), one 5 oz glass of wine (148 mL), or one 1 oz glass of hard liquor (44 mL). Drink more fluids as told. Fluids can help remove uric acid from your body. Work with your provider to make a plan to help you lose weight or stay at a healthy weight. Losing weight can help lower how much uric acid is in your blood. What foods are recommended? You can have: Fresh or frozen  fruits and vegetables. Whole grains, breads, cereals, and pasta. Rice. Beans, peas, and legumes. Nuts and seeds. Dairy products. Fats and oils. The items listed above may not be all the foods and drinks you can have. Talk with an expert in healthy eating called a dietitian to learn more. What foods are not recommended? Limit your intake of foods and drinks high in purines. These include: Beer and other types of alcohol. Meat-based gravy or sauce. Canned or fresh seafood, such as: Anchovies, sardines, herring, salmon, and tuna. Mussels, scallops, shrimp, and crab. Codfish, trout, and haddock. Bacon, veal, chicken breast with skin, and lamb. Organ meats, such as: Liver or kidney. Tripe. Sweetbreads (thymus gland or pancreas). Wild education officer, environmental. Yeast or yeast extract supplements. Drinks that have been made sweeter with high-fructose corn syrup. These include sodas. Processed foods made with high-fructose corn syrup. The items listed above may not be all the foods and drinks you should limit. Talk with a dietitian to learn more. This information is not intended to replace advice given to you by your health care provider. Make sure you discuss any questions you have with your health care provider. Document Revised: 03/28/2023 Document Reviewed: 03/28/2023 Elsevier Patient Education  2025 Elsevier Inc. Gout  Gout is painful swelling of your joints. Gout is a type of arthritis. It is caused by having too much uric acid in your body. Uric acid is a chemical that is made when your body breaks down substances called purines. If your body has too much uric acid, sharp crystals can form and build up in your joints. This causes pain and swelling. Gout attacks can happen quickly and be very painful (acute gout). Over time, the attacks can affect more joints and happen more often (chronic gout). What are the causes? Gout is caused by too much uric acid in your blood. This can happen  because: Your kidneys do not remove enough uric acid from your blood. Your body makes too much uric acid. You eat too many foods that are high in purines. These foods include organ meats, some seafood, and beer. Trauma or stress can bring on an attack. What increases the risk? Having a family history of gout. Being male and middle-aged. Being male and having gone through menopause. Having an organ transplant. Taking certain medicines. Having certain conditions, such as: Being very overweight (obese). Lead poisoning. Kidney disease. A skin condition called psoriasis. Other risks include: Losing weight too quickly. Not having enough water in the body (being dehydrated). Drinking alcohol, especially beer. Drinking beverages that are sweetened with a type of sugar called fructose. What  are the signs or symptoms? An attack of acute gout often starts at night and usually happens in just one joint. The most common place is the big toe. Other joints that may be affected include joints of the feet, ankle, knee, fingers, wrist, or elbow. Symptoms may include: Very bad pain. Warmth. Swelling. Stiffness. Tenderness. The affected joint may be very painful to touch. Shiny, red, or purple skin. Chills and fever. Chronic gout may cause symptoms more often. More joints may be involved. You may also have white or yellow lumps (tophi) on your hands or feet or in other areas near your joints. How is this treated? Treatment for an acute attack may include medicines for pain and swelling, such as: NSAIDs, such as ibuprofen. Steroids taken by mouth or injected into a joint. Colchicine. This can be given by mouth or through an IV tube. Treatment to prevent future attacks may include: Taking small doses of NSAIDs or colchicine daily. Using a medicine that reduces uric acid levels in your blood, such as allopurinol . Making changes to your diet. You may need to see a food expert (dietitian) about what  to eat and drink to prevent gout. Follow these instructions at home: During a gout attack  If told, put ice on the painful area. To do this: Put ice in a plastic bag. Place a towel between your skin and the bag. Leave the ice on for 20 minutes, 2-3 times a day. Take off the ice if your skin turns bright red. This is very important. If you cannot feel pain, heat, or cold, you have a greater risk of damage to the area. Raise the painful joint above the level of your heart as often as you can. Rest the joint as much as possible. If the joint is in your leg, you may be given crutches. Follow instructions from your doctor about what you cannot eat or drink. Avoiding future gout attacks Eat a low-purine diet. Avoid foods and drinks such as: Liver. Kidney. Anchovies. Asparagus. Herring. Mushrooms. Mussels. Beer. Stay at a healthy weight. If you want to lose weight, talk with your doctor. Do not lose weight too fast. Start or continue an exercise plan as told by your doctor. Eating and drinking Avoid drinks sweetened by fructose. Drink enough fluids to keep your pee (urine) pale yellow. If you drink alcohol: Limit how much you have to: 0-1 drink a day for women who are not pregnant. 0-2 drinks a day for men. Know how much alcohol is in a drink. In the [place] one drink equals one 12 oz bottle of beer (355 mL), one 5 oz glass of wine (148 mL), or one 1 oz glass of hard liquor (44 mL). General instructions Take over-the-counter and prescription medicines only as told by your doctor. Ask your doctor if you should avoid driving or using machines while you are taking your medicine. Return to your normal activities when your doctor says that it is safe. Keep all follow-up visits. Where to find more information [employer]: www.niams.http://www.myers.net/ Contact a doctor if: You have another gout attack. You still have symptoms of a gout attack after 10 days of treatment. You have  problems (side effects) because of your medicines. You have chills or a fever. You have burning pain when you pee (urinate). You have pain in your lower back or belly. Get help right away if: You have very bad pain. Your pain cannot be controlled. You cannot pee. Summary Gout is painful swelling  of the joints. The most common site of pain is the big toe, but it can affect other joints. Medicines and avoiding some foods can help to prevent and treat gout attacks. This information is not intended to replace advice given to you by your health care provider. Make sure you discuss any questions you have with your health care provider. Document Revised: 09/26/2020 Document Reviewed: 09/26/2020 Elsevier Patient Education  2024 Elsevier Inc. BMI for Adults Body mass index (BMI) is a number found using a person's weight and height. BMI can help tell how much of a person's weight is made up of fat. BMI does not measure body fat directly. It is used instead of tests that directly measure body fat, which can be difficult and expensive. What are BMI measurements used for? BMI is useful to: Find out if your weight puts you at higher risk for medical problems. Help recommend changes, such as in diet and exercise. This can help you reach a healthy weight. BMI screening can be done again to see if these changes are working. How is BMI calculated? Your height and weight are measured. The BMI is found from those numbers. This can be done with [place] or metric measurements. Note that charts and online BMI calculators are available to help you find your BMI quickly and easily without doing these calculations. To calculate your BMI in [place] measurements: Measure your weight in pounds (lb). Multiply the number of pounds by 703. So, for an adult who weighs 150 lb, multiply that number by 703: 150 x 703, which equals 105,450. Measure your height in inches. Then multiply that number by itself to get a measurement  called inches squared. So, for an adult who is 70 inches tall, the inches squared measurement is 70 inches x 70 inches, which equals 4,900 inches squared. Divide the total from step 2 (number of lb x 703) by the total from step 3 (inches squared): 105,450  4,900 = 21.5. This is your BMI. To calculate your BMI in metric measurements:  Measure your weight in kilograms (kg). For this example, the weight is 70 kg. Measure your height in meters (m). Then multiply that number by itself to get a measurement called meters squared. So, for an adult who is 1.75 m tall, the meters squared measurement is 1.75 m x 1.75 m, which equals 3.1 meters squared. Divide the number of kilograms (your weight) by the meters squared number. In this example: 70  3.1 = 22.6. This is your BMI. What do the results mean? BMI charts are used to see if you are underweight, normal weight, overweight, or obese. The following guidelines will be used: Underweight: BMI less than 18.5. Normal weight: BMI between 18.5 and 24.9. Overweight: BMI between 25 and 29.9. Obese: BMI of 30 or above. BMI is a tool and cannot diagnose a condition. Talk with your health care provider about what your BMI means for you. Keep these notes in mind: Weight includes fat and muscle. Someone with a muscular build, such as an athlete, may have a BMI that is higher than 24.9. In cases like these, BMI is not a correct measure of body fat. If you have a BMI of 25 or higher, your provider may need to do more testing to find out if excess body fat is the cause. BMI is measured the same way for males and females. Females usually have more body fat than males of the same height and weight. Where to find more information For  more information about BMI, including tools to quickly find your BMI, go to: Centers for Disease Control and Prevention: tonerpromos.no American Heart Association: heart.org National Heart, Lung, and Blood Institute: buffalodrycleaner.gl This  information is not intended to replace advice given to you by your health care provider. Make sure you discuss any questions you have with your health care provider. Document Revised: 09/12/2021 Document Reviewed: 09/05/2021 Elsevier Patient Education  2024 Elsevier Inc. Dyslipidemia Dyslipidemia is an imbalance of waxy, fat-like substances (lipids) in the blood. The body needs lipids in small amounts. Dyslipidemia often involves a high level of cholesterol or triglycerides, which are types of lipids. Common forms of dyslipidemia include: High levels of LDL cholesterol. LDL is the type of cholesterol that causes fatty deposits (plaques) to build up in the blood vessels that carry blood away from the heart (arteries). Low levels of HDL cholesterol. HDL cholesterol is the type of cholesterol that protects against heart disease. High levels of HDL remove the LDL buildup from arteries. High levels of triglycerides. Triglycerides are a fatty substance in the blood that is linked to a buildup of plaques in the arteries. What are the causes? There are two main types of dyslipidemia: primary and secondary. Primary dyslipidemia is caused by changes (mutations) in genes that are passed down through families (inherited). These mutations cause several types of dyslipidemia. Secondary dyslipidemia may be caused by various risk factors that can lead to the disease, such as lifestyle choices and certain medical conditions. What increases the risk? You are more likely to develop this condition if you are an older man or if you are a woman who has gone through menopause. Other risk factors include: Having a family history of dyslipidemia. Taking certain medicines, including birth control pills, steroids, some diuretics, and beta-blockers. Eating a diet high in saturated fat. Smoking cigarettes or excessive alcohol intake. Having certain medical conditions such as diabetes, polycystic ovary syndrome (PCOS), kidney  disease, liver disease, or hypothyroidism. Not exercising regularly. Being overweight or obese with too much belly fat. What are the signs or symptoms? In most cases, dyslipidemia does not usually cause any symptoms. In severe cases, very high lipid levels can cause: Fatty bumps under the skin (xanthomas). A white or gray ring around the black center (pupil) of the eye. Very high triglyceride levels can cause inflammation of the pancreas (pancreatitis). How is this diagnosed? Your health care provider may diagnose dyslipidemia based on a routine blood test (fasting blood test). Because most people do not have symptoms of the condition, this blood testing (lipid profile) is done on adults age 55 and older and is repeated every 4-6 years. This test checks: Total cholesterol. This measures the total amount of cholesterol in your blood, including LDL cholesterol, HDL cholesterol, and triglycerides. A healthy number is below 200 mg/dL (4.82 mmol/L). LDL cholesterol. The target number for LDL cholesterol is different for each person, depending on individual risk factors. A healthy number is usually below 100 mg/dL (7.40 mmol/L). Ask your health care provider what your LDL cholesterol should be. HDL cholesterol. An HDL level of 60 mg/dL (8.44 mmol/L) or higher is best because it helps to protect against heart disease. A number below 40 mg/dL (8.96 mmol/L) for men or below 50 mg/dL (8.70 mmol/L) for women increases the risk for heart disease. Triglycerides. A healthy triglyceride number is below 150 mg/dL (8.30 mmol/L). If your lipid profile is abnormal, your health care provider may do other blood tests. How is this treated? Treatment depends  on the type of dyslipidemia that you have and your other risk factors for heart disease and stroke. Your health care provider will have a target range for your lipid levels based on this information. Treatment for dyslipidemia starts with lifestyle changes, such as  diet and exercise. Your health care provider may recommend that you: Get regular exercise. Make changes to your diet. Quit smoking if you smoke. Limit your alcohol intake. If diet changes and exercise do not help you reach your goals, your health care provider may also prescribe medicine to lower lipids. The most commonly prescribed type of medicine lowers your LDL cholesterol (statin drug). If you have a high triglyceride level, your provider may prescribe another type of drug (fibrate) or an omega-3 fish oil supplement, or both. Follow these instructions at home: Eating and drinking  Follow instructions from your health care provider or dietitian about eating or drinking restrictions. Eat a healthy diet as told by your health care provider. This can help you reach and maintain a healthy weight, lower your LDL cholesterol, and raise your HDL cholesterol. This may include: Limiting your calories, if you are overweight. Eating more fruits, vegetables, whole grains, fish, and lean meats. Limiting saturated fat, trans fat, and cholesterol. Do not drink alcohol if: Your health care provider tells you not to drink. You are pregnant, may be pregnant, or are planning to become pregnant. If you drink alcohol: Limit how much you have to: 0-1 drink a day for women. 0-2 drinks a day for men. Know how much alcohol is in your drink. In the [place] one drink equals one 12 oz bottle of beer (355 mL), one 5 oz glass of wine (148 mL), or one 1 oz glass of hard liquor (44 mL). Activity Get regular exercise. Start an exercise and strength training program as told by your health care provider. Ask your health care provider what activities are safe for you. Your health care provider may recommend: 30 minutes of aerobic activity 4-6 days a week. Brisk walking is an example of aerobic activity. Strength training 2 days a week. General instructions Do not use any products that contain nicotine or tobacco. These  products include cigarettes, chewing tobacco, and vaping devices, such as e-cigarettes. If you need help quitting, ask your health care provider. Take over-the-counter and prescription medicines only as told by your health care provider. This includes supplements. Keep all follow-up visits. This is important. Contact a health care provider if: You are having trouble sticking to your exercise or diet plan. You are struggling to quit smoking or to control your use of alcohol. Summary Dyslipidemia often involves a high level of cholesterol or triglycerides, which are types of lipids. Treatment depends on the type of dyslipidemia that you have and your other risk factors for heart disease and stroke. Treatment for dyslipidemia starts with lifestyle changes, such as diet and exercise. Your health care provider may prescribe medicine to lower lipids. This information is not intended to replace advice given to you by your health care provider. Make sure you discuss any questions you have with your health care provider. Document Revised: 07/26/2021 Document Reviewed: 02/27/2020 Elsevier Patient Education  2025 [employer]. Hypertension, Adult Hypertension is another name for high blood pressure. High blood pressure forces your heart to work harder to pump blood. This can cause problems over time. There are two numbers in a blood pressure reading. There is a top number (systolic) over a bottom number (diastolic). It is best to have  a blood pressure that is below 120/80. What are the causes? The cause of this condition is not known. Some other conditions can lead to high blood pressure. What increases the risk? Some lifestyle factors can make you more likely to develop high blood pressure: Smoking. Not getting enough exercise or physical activity. Being overweight. Having too much fat, sugar, calories, or salt (sodium) in your diet. Drinking too much alcohol. Other risk factors include: Having any  of these conditions: Heart disease. Diabetes. High cholesterol. Kidney disease. Obstructive sleep apnea. Having a family history of high blood pressure and high cholesterol. Age. The risk increases with age. Stress. What are the signs or symptoms? High blood pressure may not cause symptoms. Very high blood pressure (hypertensive crisis) may cause: Headache. Fast or uneven heartbeats (palpitations). Shortness of breath. Nosebleed. Vomiting or feeling like you may vomit (nauseous). Changes in how you see. Very bad chest pain. Feeling dizzy. Seizures. How is this treated? This condition is treated by making healthy lifestyle changes, such as: Eating healthy foods. Exercising more. Drinking less alcohol. Your doctor may prescribe medicine if lifestyle changes do not help enough and if: Your top number is above 130. Your bottom number is above 80. Your personal target blood pressure may vary. Follow these instructions at home: Eating and drinking  If told, follow the DASH eating plan. To follow this plan: Fill one half of your plate at each meal with fruits and vegetables. Fill one fourth of your plate at each meal with whole grains. Whole grains include whole-wheat pasta, brown rice, and whole-grain bread. Eat or drink low-fat dairy products, such as skim milk or low-fat yogurt. Fill one fourth of your plate at each meal with low-fat (lean) proteins. Low-fat proteins include fish, chicken without skin, eggs, beans, and tofu. Avoid fatty meat, cured and processed meat, or chicken with skin. Avoid pre-made or processed food. Limit the amount of salt in your diet to less than 1,500 mg each day. Do not drink alcohol if: Your doctor tells you not to drink. You are pregnant, may be pregnant, or are planning to become pregnant. If you drink alcohol: Limit how much you have to: 0-1 drink a day for women. 0-2 drinks a day for men. Know how much alcohol is in your drink. In the [place]  one drink equals one 12 oz bottle of beer (355 mL), one 5 oz glass of wine (148 mL), or one 1 oz glass of hard liquor (44 mL). Lifestyle  Work with your doctor to stay at a healthy weight or to lose weight. Ask your doctor what the best weight is for you. Get at least 30 minutes of exercise that causes your heart to beat faster (aerobic exercise) most days of the week. This may include walking, swimming, or biking. Get at least 30 minutes of exercise that strengthens your muscles (resistance exercise) at least 3 days a week. This may include lifting weights or doing Pilates. Do not smoke or use any products that contain nicotine or tobacco. If you need help quitting, ask your doctor. Check your blood pressure at home as told by your doctor. Keep all follow-up visits. Medicines Take over-the-counter and prescription medicines only as told by your doctor. Follow directions carefully. Do not skip doses of blood pressure medicine. The medicine does not work as well if you skip doses. Skipping doses also puts you at risk for problems. Ask your doctor about side effects or reactions to medicines that you should watch for.  Contact a doctor if: You think you are having a reaction to the medicine you are taking. You have headaches that keep coming back. You feel dizzy. You have swelling in your ankles. You have trouble with your vision. Get help right away if: You get a very bad headache. You start to feel mixed up (confused). You feel weak or numb. You feel faint. You have very bad pain in your: Chest. Belly (abdomen). You vomit more than once. You have trouble breathing. These symptoms may be an emergency. Get help right away. Call 911. Do not wait to see if the symptoms will go away. Do not drive yourself to the hospital. Summary Hypertension is another name for high blood pressure. High blood pressure forces your heart to work harder to pump blood. For most people, a normal blood  pressure is less than 120/80. Making healthy choices can help lower blood pressure. If your blood pressure does not get lower with healthy choices, you may need to take medicine. This information is not intended to replace advice given to you by your health care provider. Make sure you discuss any questions you have with your health care provider. Document Revised: 10/11/2020 Document Reviewed: 10/11/2020 Elsevier Patient Education  2024 [employer] Preventing Diabetes Mellitus Complications You can help to prevent or slow down problems that are caused by diabetes (diabetes mellitus). If you follow your diabetes plan and take care of yourself, you can lower your chances of having severe problems. What actions can I take to prevent problems caused by diabetes? Managing your diabetes  Follow instructions from your health care providers about how to manage your diabetes. You may have a team of health care providers. They can teach you how to care for yourself and can answer any questions you have. Learn about your condition. This can help you make healthy choices when it comes to eating and physical activity. Know your target range for your blood sugar (glucose). Check your blood glucose levels. Your health care provider will help you decide how often to check your levels. How often you check may depend on your goals for treatment and how well you are meeting them. Ask your health care provider if you should take a low dose of aspirin every day. Ask what dose is best for you. Taking a low dose of aspirin can help prevent heart disease. Controlling your blood pressure and cholesterol Your target blood pressure is based on: Your age. Your medicines. How long you have had diabetes. Other conditions you have. Controlling your cholesterol may: Help prevent heart disease and stroke. Improve your blood flow. To control your blood pressure and cholesterol: Follow instructions from your health care  provider about meal planning, exercise, and medicines. Make sure your health care provider checks your blood pressure at every visit. Monitor your blood pressure at home as told by your health care provider. Have your cholesterol checked at least once a year. A medicine called statin can help to lower your cholesterol. Ask your health care provider if you are or should be taking a statin.   Medical appointments and vaccines Have yearly physical exams and eye exams. Your health care providers will tell you how often you need to see them. It may depend on your diabetes plan. Every visit with a health care provider should include a measure of: Your weight. Your blood pressure. Your blood glucose. Your A1C level should be checked: At least 2 times a year, if you are meeting your treatment goals.  4 times a year, if you are not meeting treatment goals or if your goals have changed. Your blood lipids (lipid profile) should be checked once a year. You should also be checked once a year for protein in your urine (urine microalbumin). If you have type 1 diabetes, get an eye exam within 5 years after you are diagnosed. Get an exam once a year after that first exam. If you have type 2 diabetes, get an eye exam as soon as you are diagnosed. Get an exam once a year after that first exam. Keep your vaccines current. You should get: A flu vaccine every year. A pneumonia vaccine and a hepatitis B vaccine. If you are 65 years or older, you may get the pneumonia vaccine as a series of two shots. Ask your health care provider what other vaccines you may need to get. Keep all follow-up visits. This can help ensure that problems can be avoided or found early and treated. Lifestyle Do not use any products that contain nicotine or tobacco. These products include cigarettes, chewing tobacco, and vaping devices, such as e-cigarettes. If you need help quitting, ask your health care provider. If you quit smoking, you  may: Lower your risk for heart attack, stroke, nerve disease, and kidney disease. Help your blood move through your body better. Help your blood pressure and cholesterol levels. Do not drink alcohol if: Your health care provider tells you not to drink. You are pregnant, may be pregnant, or are planning to become pregnant. If you drink alcohol: Limit how much you have to: 0-1 drink a day for women. 0-2 drinks a day for men. Know how much alcohol is in your drink. In the [place] one drink equals one 12 oz bottle of beer (355 mL), one 5 oz glass of wine (148 mL), or one 1 oz glass of hard liquor (44 mL). Taking care of your feet Diabetes may cause you to have poor blood flow to your legs and feet. It can also cause: The skin on your feet to get thinner, break more easily, and heal more slowly. Nerve damage in your legs and feet. This can result in less feeling. You may not notice small injuries. This could lead to bigger problems. To avoid foot problems: Check your skin and feet every day for cuts, bruises, redness, blisters, or sores. Have a foot exam with your health care provider once a year. During the exam, your health care provider will: Look at the structure and skin of your feet. Check your pulses and amount of feeling in your feet. Make sure that your health care provider does a visual foot exam at every visit.   Taking care of your teeth People who have diabetes that is not controlled well are more likely to have gum disease (periodontal disease). Diabetes can make gum disease harder to control. If not treated, it can lead to tooth loss. To prevent this: Brush your teeth twice a day. Floss at least once a day. Visit your dentist 2 times a year. Managing stress Living with diabetes can be stressful. When you are stressed, you may: Have higher blood glucose because of stress hormones. Be distracted from taking good care of yourself. Be aware of your stress level and make changes  to help you manage tough times. To lower your stress levels: Think about joining a support group. Do planned relaxation or meditation. Do a hobby that you enjoy. Maintain healthy relationships. Try to exercise every day. Work with your health  care provider or a mental health professional. Where to find more information American Diabetes Association: diabetes.org Association of Diabetes Care & Education Specialists: diabeteseducator.org This information is not intended to replace advice given to you by your health care provider. Make sure you discuss any questions you have with your health care provider. Document Revised: 06/26/2021 Document Reviewed: 06/26/2021 Elsevier Patient Education  2024 [employer] Preventing Vitamin D  Deficiency Vitamin D  deficiency is when your body doesn't have enough vitamin D . Vitamin D  is important because it helps your body maintain calcium  and phosphorus levels. Vitamin D  is also important because: It helps keep bones and teeth healthy. It reduces irritation and swelling in the body (inflammation). It makes the body's defense system (immune system) strong. Our bodies make vitamin D  when our skin is exposed to direct sunlight. But for many people, this may not be enough vitamin D  to meet the body's needs. How can vitamin D  deficiency affect me? If lack of vitamin D  is really bad: Your bones may become soft. In adults, this is called osteomalacia. In children, this is called rickets. Your bones may become weak or thin. This is called osteoporosis. What can increase my risk for vitamin D  deficiency? You may be at risk for vitamin D  deficiency if: You're pregnant. You have too much body fat (obesity). You're an older adult. You have dark skin. You take medicines that affect the way vitamin D  is absorbed. You had surgery to remove a part of the stomach or a part of the small intestine. You may also be at risk if: You have a condition that makes it hard  for you to absorb fat. This includes Crohn's disease, long-term (chronic) pancreatitis, or cystic fibrosis. You have certain conditions that are passed from parent to child (inherited). You don't have access to foods rich in vitamin D , or you follow a diet with little or no dairy, such as a vegan diet or lactose-free diet. You're not able to move or go outside safely. You live in areas that have fewer hours of sunlight. You spend most of your day indoors, or you cover your skin all the time when you're outdoors. What actions can I take to reduce my risk of vitamin D  deficiency? Know how much vitamin D  you need Different people need different amounts of vitamin D  daily: Infants: 400 international units (IU). Children older than 1 year: 600 IU. Adults: 600 IU. Pregnant and breastfeeding women: 600 IU. Adults older than 70 years: 800 IU. These are the minimum levels of recommended amounts. Your health care provider may tell you to take a different amount of vitamin D  based on your needs and your health. Know the best sources of vitamin D  You can meet your daily vitamin D  needs from: Direct exposure to natural sunlight. Foods. Dietary supplements. Infants can get their vitamin D  from infant formula. Get sun exposure Get regular, safe exposure to natural sunlight. Expose your skin to direct sunlight for at least 15 minutes every day. If you have dark skin, you may need to expose your skin for a longer period of time. Protect your skin from too much sun exposure. This helps to prevent skin cancer. Ask your provider if regular sun exposure is safe for you. Do not use a tanning bed. Follow the right diet  Eat foods that naturally contain vitamin D . These include: Beef liver. Eggs. Vitamin D  is in the yolk. Fish, such as salmon or trout. Mushrooms that were treated with UV light. Eat  or drink products that have vitamin D  added to them (fortified). These may include: Cereals. Milk,  including plant-based milk such as almond, soy, or oat milks. Orange juice. Margarine. When choosing foods, check the food label on the package to see: How much vitamin D  is in the item. If the food is fortified with vitamin D . The items listed above may not be all the foods and drinks that have vitamin D . Talk with an expert in healthy eating (dietitian) to learn more. Take supplements and medicines If you're at risk for vitamin D  deficiency, or if you have certain diseases, your provider may recommend that you take a vitamin D  supplement. Make sure you: Talk with your provider before you start taking any vitamin D  supplements. You may have side effects of vitamin D  supplements if you're on certain medicines or have certain medical conditions. Take your medicines and supplments only as told. Tell your provider about all the medicines you're taking. These include vitamins, herbs, eye drops, creams, and over-the-counter medicines. To help your body to absorb vitamin D , take your supplement with a meal or snack. This information is not intended to replace advice given to you by your health care provider. Make sure you discuss any questions you have with your health care provider. Document Revised: 04/09/2023 Document Reviewed: 04/09/2023 Elsevier Patient Education  2025 [employer].  The above assessment and management plan was discussed with the patient. The patient verbalized understanding of and has agreed to the management plan. Patient is aware to call the clinic if they develop any new symptoms or if symptoms persist or worsen. Patient is aware when to return to the clinic for a follow-up visit. Patient educated on when it is appropriate to go to the emergency department.    Erik Feutz [place] Thompson, DNP [facility] [address] [place], [place] [address] [phone]

## 2023-12-10 ENCOUNTER — Other Ambulatory Visit: Payer: Self-pay

## 2023-12-10 DIAGNOSIS — G5601 Carpal tunnel syndrome, right upper limb: Secondary | ICD-10-CM

## 2023-12-16 NOTE — Patient Instructions (Signed)
 Erik Davis  12/16/2023     @PREFPERIOPPHARMACY @   Your procedure is scheduled on 12/21/2023.   Report to [facility] at 6:00 A.M. .  Call this number if you have problems the morning of surgery:  [phone]  If you experience any cold or flu symptoms such as cough, fever, chills, shortness of breath, etc. between now and your scheduled surgery, please notify us  at the above number.   Remember:   Do not eat or drink after midnight.       Take these medicines the morning of surgery with A SIP OF WATER : Allopurinol     Do not wear jewelry, make-up or nail polish, including gel polish,  artificial nails, or any other type of covering on natural nails (fingers and  toes).  Do not wear lotions, powders, or perfumes, or deodorant.  Do not shave 48 hours prior to surgery.  Men may shave face and neck.  Do not bring valuables to the hospital.  [facility] is not responsible for any belongings or valuables.  Contacts, dentures or bridgework may not be worn into surgery.  Leave your suitcase in the car.  After surgery it may be brought to your room.  For patients admitted to the hospital, discharge time will be determined by your treatment team.  Patients discharged the day of surgery will not be allowed to drive home.   Name and phone number of your driver:   Family  Special instructions:  N/A  Please read over the following fact sheets that you were given. Care and Recovery After Surgery  Endoscopic Carpal Tunnel Release: What to Expect Endoscopic carpal tunnel release is a surgery to relieve symptoms caused by carpal tunnel syndrome (CTS). CTS causes swelling in a narrow space in your wrist. The swelling pinches the median nerve, causing pain, numbness, and weakness in your hand. You may need this surgery if other treatments haven't helped. It's a minimally invasive surgery, which means a small camera is used to guide the surgery. It requires only small cuts and has a  shorter healing time compared to open surgery. To do the surgery, the surgeon cuts a ligament in your wrist. Ligaments are the tissues that connect bones to each other. Cutting the ligament relieves pressure on the median nerve. Tell your health care provider about: Any allergies you have. All medicines you take. These include vitamins, herbs, eye drops, and creams. Any problems you or family members have had with anesthesia. Any bleeding problems you have. Any surgeries you've had. Any medical problems you have. Whether you're pregnant or may be pregnant. What are the risks? Your health care provider will talk with you about risks. These may include: Infection. Bleeding. Allergic reactions to medicines. Damage to the nerve, a blood vessel, or other nearby structures. The need to change to an open surgery. Failed treatment. The surgery fails to treat your symptoms or make your symptoms worse. Long-term weakness of hand. What happens before? When to stop eating and drinking Eat and drink only as you've been told. You may be told this: 8 hours before your surgery Stop eating most foods. Do not eat meat, fried foods, or fatty foods. Eat only light foods, such as toast or crackers. All liquids are OK except energy drinks and alcohol. 6 hours before your surgery Stop eating. Drink only clear liquids, such as water, clear fruit juice, black coffee, plain tea, and sports drinks. Do not drink energy drinks or alcohol. 2 hours before your  surgery Stop drinking all liquids. You may be allowed to take medicines with small sips of water. If you do not eat and drink as told, your surgery may be delayed or canceled. Medicines Ask about changing or stopping: Any medicines you take. Any vitamins, herbs, or supplements you take. Do not take aspirin or ibuprofen unless you're told to. Surgery safety For your safety, you may: Need to wash your skin with a soap that kills germs. Get  antibiotics. Have your surgery site marked. Have hair removed at the surgery site. General instructions Ask if you'll be staying overnight in the hospital. If you'll be going home right after the surgery, plan to have a responsible adult: Drive you home from the hospital or clinic. You won't be allowed to drive. Stay with you for the time you're told. Do not smoke, vape, or use nicotine or tobacco for at least 4 weeks before the surgery. What happens during endoscopic carpal tunnel release?  An IV will be put into a vein in your hand or arm. You may be given: A sedative to help you relax. Anesthesia to keep you from feeling pain. A small cut will be made in the crease of your wrist, near the bottom of your palm. Another cut may be made in the palm of your hand. The endoscope will be placed through one of the cuts. This enables your surgeon to see the carpal tunnel, the median nerve, and the transverse carpal ligament. An endoscopic knife will be inserted through one of the cuts. Your surgeon will cut the ligament with an upward motion. The endoscope and knife will be removed. The cut or cuts will be closed with stitches. A compression bandage will be wrapped around your hand and wrist. These steps may vary. Ask what you can expect. What happens after? You'll be watched closely until you leave. This includes checking your pain level, blood pressure, heart rate, and breathing rate. You'll be given pain medicine as needed. A splint or brace may be placed over your bandage. This will hold your hand and wrist in place while you heal. This information is not intended to replace advice given to you by your health care provider. Make sure you discuss any questions you have with your health care provider. Document Revised: 09/22/2022 Document Reviewed: 09/22/2022 Elsevier Patient Education  2024 Elsevier Inc.    General Anesthesia, Adult General anesthesia is the use of medicine to make you  fall asleep (unconscious) for a medical procedure. General anesthesia must be used for certain procedures. It is often recommended for surgery or procedures that: Last a long time. Require you to be still or in an unusual position. Are major and can cause blood loss. Affect your breathing. The medicines used for general anesthesia are called general anesthetics. During general anesthesia, these medicines are given along with medicines that: Prevent pain. Control your blood pressure. Relax your muscles. Prevent nausea and vomiting after the procedure. Tell a health care provider about: Any allergies you have. All medicines you are taking, including vitamins, herbs, eye drops, creams, and over-the-counter medicines. Your history of any: Medical conditions you have, including: High blood pressure. Bleeding problems. Diabetes. Heart or lung conditions, such as: Heart failure. Sleep apnea. Asthma. Chronic obstructive pulmonary disease (COPD). Current or recent illnesses, such as: Upper respiratory, chest, or ear infections. Cough or fever. Tobacco or drug use, including marijuana or alcohol use. Depression or anxiety. Surgeries and types of anesthetics you have had. Problems you or family members  have had with anesthetic medicines. Whether you are pregnant or may be pregnant. Whether you have any chipped or loose teeth, dentures, caps, bridgework, or issues with your mouth, swallowing, or choking. What are the risks? Your health care provider will talk with you about risks. These may include: Allergic reaction to the medicines. Lung and heart problems. Inhaling food or liquid from the stomach into the lungs (aspiration). Nerve injury. Injury to the lips, mouth, teeth, or gums. Stroke. Waking up during your procedure and being unable to move. This is rare. These problems are more likely to develop if you are having a major surgery or if you have an advanced or serious medical  condition. You can prevent some of these complications by answering all of your health care provider's questions thoroughly and by following all instructions before your procedure. General anesthesia can cause side effects, including: Nausea or vomiting. A sore throat or hoarseness from the breathing tube. Wheezing or coughing. Shaking chills or feeling cold. Body aches. Sleepiness. Confusion, agitation (delirium), or anxiety. What happens before the procedure? When to stop eating and drinking Follow instructions from your health care provider about what you may eat and drink before your procedure. If you do not follow your health care provider's instructions, your procedure may be delayed or canceled. Medicines Ask your health care provider about: Changing or stopping your regular medicines. These include any diabetes medicines or blood thinners you take. Taking medicines such as aspirin and ibuprofen. These medicines can thin your blood. Do not take them unless your health care provider tells you to. Taking over-the-counter medicines, vitamins, herbs, and supplements. General instructions Do not use any products that contain nicotine or tobacco for at least 4 weeks before the procedure. These products include cigarettes, chewing tobacco, and vaping devices, such as e-cigarettes. If you need help quitting, ask your health care provider. If you brush your teeth on the morning of the procedure, make sure to spit out all of the water and toothpaste. If told by your health care provider, bring your sleep apnea device with you to surgery (if applicable). If you will be going home right after the procedure, plan to have a responsible adult: Take you home from the hospital or clinic. You will not be allowed to drive. Care for you for the time you are told. What happens during the procedure?  An IV will be inserted into one of your veins. You will be given one or more of the following through a  face mask or IV: A sedative. This helps you relax. Anesthesia. This will: Numb certain areas of your body. Make you fall asleep for surgery. After you are unconscious, a breathing tube may be inserted down your throat to help you breathe. This will be removed before you wake up. An anesthesia provider, such as an anesthesiologist, will stay with you throughout your procedure. The anesthesia provider will: Keep you comfortable and safe by continuing to give you medicines and adjusting the amount of medicine that you get. Monitor your blood pressure, heart rate, and oxygen levels to make sure that the anesthetics do not cause any problems. The procedure may vary among health care providers and hospitals. What happens after the procedure? Your blood pressure, temperature, heart rate, breathing rate, and blood oxygen level will be monitored until you leave the hospital or clinic. You will wake up in a recovery area. You may wake up slowly. You may be given medicine to help you with pain, nausea, or any  other side effects from the anesthesia. Summary General anesthesia is the use of medicine to make you fall asleep (unconscious) for a medical procedure. Follow your health care provider's instructions about when to stop eating, drinking, or taking certain medicines before your procedure. Plan to have a responsible adult take you home from the hospital or clinic. This information is not intended to replace advice given to you by your health care provider. Make sure you discuss any questions you have with your health care provider. Document Revised: 03/21/2021 Document Reviewed: 03/21/2021 Elsevier Patient Education  2024 Elsevier Inc.  How to Use Chlorhexidine at Home in the Shower Chlorhexidine gluconate (CHG) is a germ-killing (antiseptic) wash that's used to clean the skin. It can get rid of the germs that normally live on the skin and can keep them away for about 24 hours. If you're having  surgery, you may be told to shower with CHG at home the night before surgery. This can help lower your risk for infection. To use CHG wash in the shower, follow the steps below. Supplies needed: CHG body wash. Clean washcloth. Clean towel. How to use CHG in the shower Follow these steps unless you're told to use CHG in a different way: Start the shower. Use your normal soap and shampoo to wash your face and hair. Turn off the shower or move out of the shower stream. Pour CHG onto a clean washcloth. Do not use any type of brush or rough sponge. Start at your neck, washing your body down to your toes. Make sure you: Wash the part of your body where the surgery will be done for at least 1 minute. Do not scrub. Do not use CHG on your head or face unless your health care provider tells you to. If it gets into your ears or eyes, rinse them well with water. Do not wash your genitals with CHG. Wash your back and under your arms. Make sure to wash skin folds. Let the CHG sit on your skin for 1-2 minutes or as long as told. Rinse your entire body in the shower, including all body creases and folds. Turn off the shower. Dry off with a clean towel. Do not put anything on your skin afterward, such as powder, lotion, or perfume. Put on clean clothes or pajamas. If it's the night before surgery, sleep in clean sheets. General tips Use CHG only as told, and follow the instructions on the label. Use the full amount of CHG as told. This is often one bottle. Do not smoke and stay away from flames after using CHG. Your skin may feel sticky after using CHG. This is normal. The sticky feeling will go away as the CHG dries. Do not use CHG: If you have a chlorhexidine allergy or have reacted to chlorhexidine in the past. On open wounds or areas of skin that have broken skin, cuts, or scrapes. On babies younger than 58 months of age. Contact a health care provider if: You have questions about using  CHG. Your skin gets irritated or itchy. You have a rash after using CHG. You swallow any CHG. Call your local poison control center [phone] in the [place] Your eyes itch badly, or they become very red or swollen. Your hearing changes. You have trouble seeing. If you can't reach your provider, go to an urgent care or emergency room. Do not drive yourself. Get help right away if: You have swelling or tingling in your mouth or throat. You make high-pitched whistling  sounds when you breathe, most often when you breathe out (wheeze). You have trouble breathing. These symptoms may be an emergency. Call 911 right away. Do not wait to see if the symptoms will go away. Do not drive yourself to the hospital. This information is not intended to replace advice given to you by your health care provider. Make sure you discuss any questions you have with your health care provider. Document Revised: 07/08/2022 Document Reviewed: 07/04/2021 Elsevier Patient Education  2024 [employer].

## 2023-12-17 ENCOUNTER — Encounter (HOSPITAL_COMMUNITY): Payer: Self-pay

## 2023-12-17 ENCOUNTER — Other Ambulatory Visit: Payer: Self-pay

## 2023-12-17 ENCOUNTER — Other Ambulatory Visit (HOSPITAL_COMMUNITY): Payer: Self-pay

## 2023-12-17 ENCOUNTER — Inpatient Hospital Stay (HOSPITAL_COMMUNITY): Admission: RE | Admit: 2023-12-17 | Discharge: 2023-12-17 | Attending: Orthopedic Surgery

## 2023-12-17 ENCOUNTER — Other Ambulatory Visit: Payer: Self-pay | Admitting: Nurse Practitioner

## 2023-12-17 ENCOUNTER — Telehealth: Payer: Self-pay | Admitting: Pharmacy Technician

## 2023-12-17 VITALS — BP 115/72 | HR 79 | Resp 18 | Ht 74.0 in | Wt 296.0 lb

## 2023-12-17 DIAGNOSIS — E1169 Type 2 diabetes mellitus with other specified complication: Secondary | ICD-10-CM

## 2023-12-17 DIAGNOSIS — E1159 Type 2 diabetes mellitus with other circulatory complications: Secondary | ICD-10-CM | POA: Diagnosis not present

## 2023-12-17 DIAGNOSIS — I152 Hypertension secondary to endocrine disorders: Secondary | ICD-10-CM | POA: Diagnosis not present

## 2023-12-17 DIAGNOSIS — Z01818 Encounter for other preprocedural examination: Secondary | ICD-10-CM | POA: Diagnosis not present

## 2023-12-17 LAB — BASIC METABOLIC PANEL WITH GFR
Anion gap: 13 (ref 5–15)
BUN: 14 mg/dL (ref 6–20)
CO2: 24 mmol/L (ref 22–32)
Calcium: 9.5 mg/dL (ref 8.9–10.3)
Chloride: 104 mmol/L (ref 98–111)
Creatinine, Ser: 1.04 mg/dL (ref 0.61–1.24)
GFR, Estimated: >60 mL/min (ref >60)
Glucose, Bld: 112 mg/dL — ABNORMAL HIGH (ref 70–99)
Potassium: 3.8 mmol/L (ref 3.5–5.1)
Sodium: 140 mmol/L (ref 135–145)

## 2023-12-17 LAB — HEMOGLOBIN A1C
Hgb A1c MFr Bld: 6.2 % — ABNORMAL HIGH (ref 4.8–5.6)
Mean Plasma Glucose: 131.24 mg/dL

## 2023-12-17 NOTE — Progress Notes (Signed)
 Ozempic  was prescribed for the patient however it is not covered by his insurance despite him being diabetic and obese.  Will try to prescribe Mounjaro to see if he will be covered by his insurance

## 2023-12-17 NOTE — Telephone Encounter (Signed)
 Pharmacy Patient Advocate Encounter   Received notification from [employer] that prior authorization for Ozempic  (0.25 or 0.5 mg/dose) 2 mg/3ml is required/requested.   Insurance verification completed.   The patient is insured through [employer].   Per test claim: Per test claim, medication is not covered due to plan/benefit exclusion, PA not submitted at this time

## 2023-12-21 ENCOUNTER — Ambulatory Visit (HOSPITAL_COMMUNITY): Admitting: Certified Registered Nurse Anesthetist

## 2023-12-21 ENCOUNTER — Other Ambulatory Visit: Payer: Self-pay

## 2023-12-21 ENCOUNTER — Encounter: Admission: RE | Disposition: A | Payer: Self-pay | Attending: Orthopedic Surgery

## 2023-12-21 ENCOUNTER — Ambulatory Visit (HOSPITAL_COMMUNITY)
Admission: RE | Admit: 2023-12-21 | Discharge: 2023-12-21 | Disposition: A | Attending: Orthopedic Surgery | Admitting: Orthopedic Surgery

## 2023-12-21 ENCOUNTER — Encounter (HOSPITAL_COMMUNITY): Payer: Self-pay | Admitting: Orthopedic Surgery

## 2023-12-21 DIAGNOSIS — I10 Essential (primary) hypertension: Secondary | ICD-10-CM | POA: Insufficient documentation

## 2023-12-21 DIAGNOSIS — K219 Gastro-esophageal reflux disease without esophagitis: Secondary | ICD-10-CM | POA: Diagnosis not present

## 2023-12-21 DIAGNOSIS — Z87891 Personal history of nicotine dependence: Secondary | ICD-10-CM | POA: Diagnosis not present

## 2023-12-21 DIAGNOSIS — Z6837 Body mass index (BMI) 37.0-37.9, adult: Secondary | ICD-10-CM | POA: Diagnosis not present

## 2023-12-21 DIAGNOSIS — G5601 Carpal tunnel syndrome, right upper limb: Secondary | ICD-10-CM | POA: Diagnosis not present

## 2023-12-21 DIAGNOSIS — G5603 Carpal tunnel syndrome, bilateral upper limbs: Secondary | ICD-10-CM | POA: Insufficient documentation

## 2023-12-21 DIAGNOSIS — E66813 Obesity, class 3: Secondary | ICD-10-CM | POA: Diagnosis not present

## 2023-12-21 DIAGNOSIS — E119 Type 2 diabetes mellitus without complications: Secondary | ICD-10-CM | POA: Insufficient documentation

## 2023-12-21 HISTORY — PX: CARPAL TUNNEL RELEASE: SHX101

## 2023-12-21 LAB — GLUCOSE, CAPILLARY: Glucose-Capillary: 112 mg/dL — ABNORMAL HIGH (ref 70–99)

## 2023-12-21 SURGERY — CARPAL TUNNEL RELEASE
Anesthesia: General | Site: Hand | Laterality: Right

## 2023-12-21 MED ORDER — KETAMINE HCL 50 MG/5ML IJ SOSY
PREFILLED_SYRINGE | INTRAMUSCULAR | Status: DC | PRN
  Administered 2023-12-21: 08:00:00 30 mg via INTRAVENOUS

## 2023-12-21 MED ORDER — MIDAZOLAM HCL 2 MG/2ML IJ SOLN
INTRAMUSCULAR | Status: AC
  Filled 2023-12-21: qty 2

## 2023-12-21 MED ORDER — PROPOFOL 500 MG/50ML IV EMUL
INTRAVENOUS | Status: AC
  Filled 2023-12-21: qty 100

## 2023-12-21 MED ORDER — LIDOCAINE 2% (20 MG/ML) 5 ML SYRINGE
INTRAMUSCULAR | Status: AC
  Filled 2023-12-21: qty 5

## 2023-12-21 MED ORDER — LIDOCAINE HCL 1 % IJ SOLN
INTRAMUSCULAR | Status: DC | PRN
  Administered 2023-12-21: 09:00:00 10 mL via INTRADERMAL
  Administered 2023-12-21: 08:00:00 8 mL via INTRADERMAL

## 2023-12-21 MED ORDER — PROPOFOL 500 MG/50ML IV EMUL
INTRAVENOUS | Status: DC | PRN
  Administered 2023-12-21 (×2): 75 ug/kg/min via INTRAVENOUS

## 2023-12-21 MED ORDER — FENTANYL CITRATE (PF) 100 MCG/2ML IJ SOLN
INTRAMUSCULAR | Status: DC | PRN
  Administered 2023-12-21: 08:00:00 50 ug via INTRAVENOUS
  Administered 2023-12-21 (×2): 25 ug via INTRAVENOUS

## 2023-12-21 MED ORDER — PROPOFOL 500 MG/50ML IV EMUL
INTRAVENOUS | Status: AC
  Filled 2023-12-21: qty 150

## 2023-12-21 MED ORDER — BUPIVACAINE HCL (PF) 0.5 % IJ SOLN
INTRAMUSCULAR | Status: AC
  Filled 2023-12-21: qty 30

## 2023-12-21 MED ORDER — CEFAZOLIN SODIUM-DEXTROSE 3-4 GM/150ML-% IV SOLN
3.0000 g | INTRAVENOUS | Status: AC
  Administered 2023-12-21: 08:00:00 3 g via INTRAVENOUS

## 2023-12-21 MED ORDER — CEFAZOLIN SODIUM-DEXTROSE 2-4 GM/100ML-% IV SOLN
INTRAVENOUS | Status: AC
  Filled 2023-12-21: qty 100

## 2023-12-21 MED ORDER — PROPOFOL 10 MG/ML IV BOLUS
INTRAVENOUS | Status: DC | PRN
  Administered 2023-12-21: 08:00:00 20 mg via INTRAVENOUS
  Administered 2023-12-21: 08:00:00 30 mg via INTRAVENOUS

## 2023-12-21 MED ORDER — LACTATED RINGERS IV SOLN: INTRAVENOUS | Status: DC | PRN

## 2023-12-21 MED ORDER — HYDROCODONE-ACETAMINOPHEN 5-325 MG PO TABS: 1.0000 | ORAL_TABLET | Freq: Four times a day (QID) | ORAL | 0 refills | Status: AC | PRN

## 2023-12-21 MED ORDER — KETAMINE HCL 50 MG/5ML IJ SOSY
PREFILLED_SYRINGE | INTRAMUSCULAR | Status: AC
  Filled 2023-12-21: qty 5

## 2023-12-21 MED ORDER — KETOROLAC TROMETHAMINE 30 MG/ML IJ SOLN
INTRAMUSCULAR | Status: AC
  Filled 2023-12-21: qty 1

## 2023-12-21 MED ORDER — MIDAZOLAM HCL (PF) 2 MG/2ML IJ SOLN
INTRAMUSCULAR | Status: DC | PRN
  Administered 2023-12-21: 08:00:00 2 mg via INTRAVENOUS

## 2023-12-21 MED ORDER — LIDOCAINE 2% (20 MG/ML) 5 ML SYRINGE
INTRAMUSCULAR | Status: DC | PRN
  Administered 2023-12-21: 08:00:00 60 mg via INTRAVENOUS

## 2023-12-21 MED ORDER — CEFAZOLIN SODIUM-DEXTROSE 3-4 GM/150ML-% IV SOLN
INTRAVENOUS | Status: AC
  Filled 2023-12-21: qty 150

## 2023-12-21 MED ORDER — PROPOFOL 10 MG/ML IV BOLUS
INTRAVENOUS | Status: AC
  Filled 2023-12-21: qty 20

## 2023-12-21 MED ORDER — FENTANYL CITRATE (PF) 100 MCG/2ML IJ SOLN
INTRAMUSCULAR | Status: AC
  Filled 2023-12-21: qty 2

## 2023-12-21 MED ORDER — KETOROLAC TROMETHAMINE 30 MG/ML IJ SOLN
INTRAMUSCULAR | Status: DC | PRN
  Administered 2023-12-21: 09:00:00 30 mg via INTRAVENOUS

## 2023-12-21 MED ORDER — 0.9 % SODIUM CHLORIDE (POUR BTL) OPTIME
TOPICAL | Status: DC | PRN
  Administered 2023-12-21: 08:00:00 1000 mL

## 2023-12-21 MED ORDER — ONDANSETRON HCL 4 MG PO TABS: 4.0000 mg | ORAL_TABLET | Freq: Three times a day (TID) | ORAL | 0 refills | Status: AC | PRN

## 2023-12-21 MED ORDER — LIDOCAINE HCL (PF) 1 % IJ SOLN
INTRAMUSCULAR | Status: AC
  Filled 2023-12-21: qty 30

## 2023-12-21 SURGICAL SUPPLY — 30 items
BANDAGE ESMARK 4X12 BL STRL LF (DISPOSABLE) ×1 IMPLANT
BLADE SURG 15 STRL LF DISP TIS (BLADE) ×1 IMPLANT
BNDG ELASTIC 3X5.8 VLCR NS LF (GAUZE/BANDAGES/DRESSINGS) ×1 IMPLANT
BNDG GAUZE DERMACEA FLUFF 4 (GAUZE/BANDAGES/DRESSINGS) ×1 IMPLANT
CHLORAPREP W/TINT 26 (MISCELLANEOUS) ×1 IMPLANT
CLOTH BEACON ORANGE TIMEOUT ST (SAFETY) ×1 IMPLANT
CORD BIPOLAR FORCEPS 12FT (ELECTRODE) ×1 IMPLANT
COVER LIGHT HANDLE (MISCELLANEOUS) IMPLANT
CUFF TOURN SGL QUICK 18X4 (TOURNIQUET CUFF) ×1 IMPLANT
DRAPE HALF SHEET 40X57 (DRAPES) ×1 IMPLANT
GAUZE SPONGE 4X4 12PLY STRL (GAUZE/BANDAGES/DRESSINGS) ×1 IMPLANT
GAUZE XEROFORM 1X8 LF (GAUZE/BANDAGES/DRESSINGS) ×1 IMPLANT
GLOVE BIO SURGEON STRL SZ8.5 (GLOVE) ×2 IMPLANT
GLOVE BIOGEL PI IND STRL 7.0 (GLOVE) ×2 IMPLANT
GLOVE BIOGEL PI IND STRL 8.5 (GLOVE) ×1 IMPLANT
GOWN STRL REUS W/TWL LRG LVL3 (GOWN DISPOSABLE) ×1 IMPLANT
GOWN STRL REUS W/TWL XL LVL3 (GOWN DISPOSABLE) ×1 IMPLANT
KIT TURNOVER KIT A (KITS) ×1 IMPLANT
MANIFOLD NEPTUNE II (INSTRUMENTS) ×1 IMPLANT
NDL HYPO 21X1.5 SAFETY (NEEDLE) ×1 IMPLANT
NEEDLE HYPO 21X1.5 SAFETY (NEEDLE) ×1 IMPLANT
PACK BASIC LIMB (CUSTOM PROCEDURE TRAY) ×1 IMPLANT
PAD ARMBOARD POSITIONER FOAM (MISCELLANEOUS) ×1 IMPLANT
POSITIONER HAND ALUMI XLG (MISCELLANEOUS) ×1 IMPLANT
POSITIONER HEAD 8X9X4 ADT (SOFTGOODS) ×1 IMPLANT
SET BASIN LINEN APH (SET/KITS/TRAYS/PACK) ×1 IMPLANT
SOLN 0.9% NACL POUR BTL 1000ML (IV SOLUTION) ×1 IMPLANT
SUT PROLENE NAB BLUE 3-0 30IN (SUTURE) ×1 IMPLANT
SYR CONTROL 10ML LL (SYRINGE) ×1 IMPLANT
UNDERPAD 30X36 HEAVY ABSORB (UNDERPADS AND DIAPERS) ×1 IMPLANT

## 2023-12-21 NOTE — Op Note (Signed)
 Orthopaedic Surgery Operative Note (CSN: [id number])  Erik Davis  05/27/1980 Date of Surgery: 12/21/2023   Diagnoses:  Right carpal tunnel syndrome  Procedure: Right Open Carpal Tunnel Release   Operative Finding Successful completion of the planned procedure.    Post-Op Diagnosis: Same Surgeons:Primary: Onesimo Oneil LABOR, MD Assistants: None Location: [facility] OR ROOM 3 Anesthesia: General with local anesthesia Antibiotics: Ancef  2 g Tourniquet time:  Total Tourniquet Time Documented: Upper Arm (Right) - 34 minutes Total: Upper Arm (Right) - 34 minutes  Estimated Blood Loss: 5 cc Complications: None Specimens: None  Implants: None  Indications for Surgery:   Erik Davis is a 43 y.o. male with symptoms consistent with carpal tunnel syndrome.  Symptoms have been ongoing, and progressively worsening.  They have tried medications and bracing without improvement in symptoms.  EMG results demonstrate severe carpal tunnel syndrome.  Risks and benefits of operative and nonoperative management were discussed prior to surgery with the patient and informed consent form was completed.  Specific risks including infection, need for additional surgery, bleeding, recurrent symptoms, incomplete resolution of symptoms, persistent pain and more severe complications associated with anesthesia were discussed.  All questions were answered.  Surgical consent was finalized.    Procedure:   The patient was identified properly. Informed consent was finalized and the surgical site was marked. The patient was taken to the OR where sedation and local anesthesia was induced.  The patient was positioned supine, on a hand table.  The right arm was prepped and draped in the usual sterile fashion.  Timeout was performed before the beginning of the case.  Tourniquet was used for the above duration.  Antibiotics were administered prior to making incision.  Incision was made in line with the radial border of the ring  finger. The carpal tunnel transverse fascia was identified, cleaned, and incised sharply. The common sensory branches were visualized along with the superficial palmar arch and protected.  The median nerve was protected below. Deep retractors were placed underneath the transverse carpal ligament, protecting the nerve. I released the ligament completely, and then released the distal volar forearm fascia. The nerve was identified, and visualized, and protected throughout the case. No masses or abnormalities were identified in ulnar bursa.    The wounds were irrigated copiously, and the wounds injected. Skin closed with interrupted sutures followed by a bulky dressing. Patient  tolerated this well, with no complications.   Post-operative plan:  The patient will be discharged home from the PACU. WBAT on the operative extremity; limit lifting to nothing more than a coffee cup until follow up appointment   DVT prophylaxis not indicated in this ambulatory upper extremity patient without significant risk factors.    Pain control with PRN pain medication preferring oral medicines.   Follow up plan will be scheduled in approximately 7-10 days for incision check

## 2023-12-21 NOTE — Anesthesia Preprocedure Evaluation (Signed)
 Anesthesia Evaluation  Patient identified by MRN, date of birth, ID band Patient awake    Reviewed: Allergy & Precautions, H&P , NPO status , Patient's Chart, lab work & pertinent test results, reviewed documented beta blocker date and time   Airway Mallampati: II  TM Distance: >3 FB Neck ROM: full    Dental no notable dental hx.    Pulmonary neg pulmonary ROS, former smoker   Pulmonary exam normal breath sounds clear to auscultation       Cardiovascular Exercise Tolerance: Good hypertension,  Rhythm:regular Rate:Normal     Neuro/Psych  PSYCHIATRIC DISORDERS   Bipolar Disorder    Neuromuscular disease    GI/Hepatic Neg liver ROS,GERD  ,,  Endo/Other  diabetes  Class 3 obesity  Renal/GU negative Renal ROS  negative genitourinary   Musculoskeletal   Abdominal   Peds  Hematology negative hematology ROS (+)   Anesthesia Other Findings   Reproductive/Obstetrics negative OB ROS                              Anesthesia Physical Anesthesia Plan  ASA: 3  Anesthesia Plan: General   Post-op Pain Management:    Induction:   PONV Risk Score and Plan: Propofol  infusion  Airway Management Planned:   Additional Equipment:   Intra-op Plan:   Post-operative Plan:   Informed Consent: I have reviewed the patients History and Physical, chart, labs and discussed the procedure including the risks, benefits and alternatives for the proposed anesthesia with the patient or authorized representative who has indicated his/her understanding and acceptance.     Dental Advisory Given  Plan Discussed with: CRNA  Anesthesia Plan Comments:         Anesthesia Quick Evaluation

## 2023-12-21 NOTE — Discharge Instructions (Signed)
  Brya Simerly A. Onesimo, MD MS [facility] [address] [place],  [place]  [address] Phone: [phone] Fax: [phone]    POST-OPERATIVE INSTRUCTIONS   WOUND CARE You may remove your bandage on postop day 3 and get the hand wet.  No ointments or lotions to be applied to the incision.  Do not submerge the incision for 1 month.  FOLLOW-UP If you develop a Fever (>101.5), Redness or Drainage from the surgical incision site, please call our office to arrange for an evaluation. Please call the office to schedule a follow-up appointment for your incision check if you do not already have one, 7-10 days post-operatively.  IF YOU HAVE ANY QUESTIONS, PLEASE FEEL FREE TO CALL OUR OFFICE.  HELPFUL INFORMATION  You should wean off your narcotic medicines as soon as you are able.  Most patients will be off or using minimal narcotics before their first postop appointment.   You may be more comfortable sleeping in a semi-seated position the first few nights following surgery.  Keep a pillow propped under the elbow and forearm for comfort.  If you have a recliner type of chair it might be beneficial.    We suggest you use the pain medication the first night prior to going to bed, in order to ease any pain when the anesthesia wears off. You should avoid taking pain medications on an empty stomach as it will make you nauseous.  Do not drink alcoholic beverages or take illicit drugs when taking pain medications.  You may return to work/school in the next couple of days when you feel up to it. Desk work and typing is fine.  Pain medication may make you constipated.  Below are a few solutions to try in this order: Decrease the amount of pain medication if you aren't having pain. Drink lots of decaffeinated fluids. Drink prune juice and/or each dried prunes  If the first 3 don't work start with additional solutions Take Colace - an over-the-counter stool softener Take  Senokot - an over-the-counter laxative Take Miralax  - a stronger over-the-counter laxative   Narcotic Management  Per [facility] clinic policy, our goal is ensure optimal postoperative pain control with a multimodal pain management strategy.   For all [facility] patients, our goal is to wean post-operative narcotic medications by 6 weeks post-operatively.  If this is not possible due to utilization of pain medication prior to surgery, your [facility] doctor will support your acute post-operative pain control for the first 6 weeks postoperatively, with a plan to transition you back to your primary pain team following that.   Maralee will work to ensure a Therapist, occupational.

## 2023-12-21 NOTE — Interval H&P Note (Signed)
 History and Physical Interval Note:  12/21/2023 7:58 AM  Erik Davis  has presented today for surgery, with the diagnosis of Right carpal tunnel syndrome.  The various methods of treatment have been discussed with the patient and family. After consideration of risks, benefits and other options for treatment, the patient has consented to  Procedures: CARPAL TUNNEL RELEASE (Right) as a surgical intervention.  The patient's history has been reviewed, patient examined, no change in status, stable for surgery.  I have reviewed the patient's chart and labs.  Questions were answered to the patient's satisfaction.     [name]

## 2023-12-21 NOTE — Transfer of Care (Signed)
 Immediate Anesthesia Transfer of Care Note  Patient: Erik Davis  Procedure(s) Performed: CARPAL TUNNEL RELEASE (Right: Hand)  Patient Location: Short Stay  Anesthesia Type:General  Level of Consciousness: awake, alert , and oriented  Airway & Oxygen Therapy: Patient Spontanous Breathing  Post-op Assessment: Report given to RN and Post -op Vital signs reviewed and stable  Post vital signs: Reviewed and stable  Last Vitals:  Vitals Value Taken Time  BP 137/78 12/21/23 09:05  Temp 36.4 C 12/21/23 09:05  Pulse 77 12/21/23 09:05  Resp 19 12/21/23 09:05  SpO2 98 % 12/21/23 09:05    Last Pain:  Vitals:   12/21/23 0905  TempSrc: Oral  PainSc: 0-No pain      Patients Stated Pain Goal: 4 (12/21/23 0709)  Complications: No notable events documented.

## 2023-12-21 NOTE — Telephone Encounter (Signed)
 Left detailed message per dpr and advised pt to call back with any issues with medication

## 2023-12-22 ENCOUNTER — Encounter (HOSPITAL_COMMUNITY): Payer: Self-pay | Admitting: Orthopedic Surgery

## 2023-12-25 NOTE — Anesthesia Postprocedure Evaluation (Signed)
"   Anesthesia Post Note  Patient: Erik Davis  Procedure(s) Performed: CARPAL TUNNEL RELEASE (Right: Hand)  Patient location during evaluation: Phase II Anesthesia Type: General Level of consciousness: awake Pain management: pain level controlled Vital Signs Assessment: post-procedure vital signs reviewed and stable Respiratory status: spontaneous breathing and respiratory function stable Cardiovascular status: blood pressure returned to baseline and stable Postop Assessment: no headache and no apparent nausea or vomiting Anesthetic complications: no Comments: Late entry   No notable events documented.   Last Vitals:  Vitals:   12/21/23 0709 12/21/23 0905  BP: (!) 149/93 137/78  Pulse: 79 77  Resp: 19 19  Temp: 36.8 C (!) 36.4 C  SpO2: 98% 98%    Last Pain:  Vitals:   12/21/23 0905  TempSrc: Oral  PainSc: 0-No pain                 Yvonna PARAS [name]      "

## 2023-12-29 ENCOUNTER — Ambulatory Visit: Admitting: Orthopedic Surgery

## 2023-12-29 ENCOUNTER — Encounter: Payer: Self-pay | Admitting: Orthopedic Surgery

## 2023-12-29 DIAGNOSIS — G5601 Carpal tunnel syndrome, right upper limb: Secondary | ICD-10-CM

## 2023-12-29 NOTE — Progress Notes (Signed)
 Orthopaedic Postop Note  Assessment: Erik Davis is a 43 y.o. male s/p right open carpal tunnel release   DOS: 12/21/2023  Plan: Rulon Abdalla has done well.  Denies burning or shooting pains.  Some persistent tingling sensations in the median nerve distribution.  Surgical incision is healing well.  Sutures were removed, and Steri-Strips were placed.  Okay to return to work.  Keep incision covered.  Do not submerge the wound.  Return in 4 weeks.    Follow-up: Return in about 4 weeks (around 01/26/2024). XR at next visit: None  Subjective:  Chief Complaint  Patient presents with   Post-op Problem    [facility] DOS: 12/15/23    History of Present Illness: Erik Davis is a 43 y.o. male who presents following the above stated procedure.  Surgery was approximately ago.  No issues.  Some pain for the first few days.  Burning and shooting pains have almost completely resolved.  Some numbness in the fingers.   No issues with the incision  Review of Systems: No fevers or chills Some numbness or tingling No Chest Pain No shortness of breath   Objective: There were no vitals taken for this visit.  Physical Exam:  Alert and oriented, no acute distress.  Surgical incision is healing well.  Some swelling persists, with mild dehiscence noted distally.  Small amount of active bleeding after removal of the sutures. No surrounding erythema or drainage.  Mild tenderness to palpation about surgical site.  Sensation is intact in the median nerve distribution.  Able to make a full fist. No redness. 2+ radial pulse.   IMAGING: I personally ordered and reviewed the following images:  No new imaging obtained today  [name], MD 12/29/2023 1:38 PM

## 2024-01-05 ENCOUNTER — Encounter: Admitting: Orthopedic Surgery

## 2024-01-26 ENCOUNTER — Ambulatory Visit: Admitting: Orthopedic Surgery

## 2024-02-02 ENCOUNTER — Ambulatory Visit: Admitting: Orthopedic Surgery

## 2024-02-09 ENCOUNTER — Ambulatory Visit: Payer: Self-pay | Admitting: Orthopedic Surgery

## 2024-02-26 ENCOUNTER — Ambulatory Visit: Payer: Self-pay | Admitting: Orthopedic Surgery
